# Patient Record
Sex: Female | Born: 1950 | Race: Black or African American | Hispanic: No | Marital: Single | Smoking: Never smoker
Health system: Southern US, Community
[De-identification: ages and names within clinical notes are randomized; demographics above are authoritative.]

## PROBLEM LIST (undated history)

## (undated) DIAGNOSIS — E119 Type 2 diabetes mellitus without complications: Secondary | ICD-10-CM

## (undated) DIAGNOSIS — K219 Gastro-esophageal reflux disease without esophagitis: Secondary | ICD-10-CM

## (undated) DIAGNOSIS — D649 Anemia, unspecified: Secondary | ICD-10-CM

## (undated) DIAGNOSIS — R011 Cardiac murmur, unspecified: Secondary | ICD-10-CM

## (undated) DIAGNOSIS — I10 Essential (primary) hypertension: Secondary | ICD-10-CM

## (undated) DIAGNOSIS — M4802 Spinal stenosis, cervical region: Secondary | ICD-10-CM

## (undated) DIAGNOSIS — M810 Age-related osteoporosis without current pathological fracture: Secondary | ICD-10-CM

## (undated) DIAGNOSIS — Z87442 Personal history of urinary calculi: Secondary | ICD-10-CM

## (undated) DIAGNOSIS — M5030 Other cervical disc degeneration, unspecified cervical region: Secondary | ICD-10-CM

## (undated) DIAGNOSIS — D869 Sarcoidosis, unspecified: Secondary | ICD-10-CM

## (undated) DIAGNOSIS — E785 Hyperlipidemia, unspecified: Secondary | ICD-10-CM

## (undated) DIAGNOSIS — M5134 Other intervertebral disc degeneration, thoracic region: Secondary | ICD-10-CM

## (undated) HISTORY — PX: COLONOSCOPY: SHX174

## (undated) HISTORY — DX: Cardiac murmur, unspecified: R01.1

## (undated) HISTORY — DX: Gastro-esophageal reflux disease without esophagitis: K21.9

## (undated) HISTORY — PX: TUBAL LIGATION: SHX77

---

## 1999-03-10 HISTORY — PX: CARPAL TUNNEL RELEASE: SHX101

## 2005-03-09 HISTORY — PX: CARPAL TUNNEL RELEASE: SHX101

## 2005-05-19 ENCOUNTER — Ambulatory Visit: Payer: Self-pay | Admitting: Internal Medicine

## 2005-05-25 ENCOUNTER — Ambulatory Visit: Payer: Self-pay | Admitting: Internal Medicine

## 2005-08-07 ENCOUNTER — Ambulatory Visit: Payer: Self-pay | Admitting: Unknown Physician Specialty

## 2006-08-17 ENCOUNTER — Ambulatory Visit: Payer: Self-pay | Admitting: Internal Medicine

## 2006-10-08 ENCOUNTER — Ambulatory Visit: Payer: Self-pay | Admitting: Gastroenterology

## 2006-10-08 HISTORY — PX: ESOPHAGOGASTRODUODENOSCOPY: SHX1529

## 2007-09-01 ENCOUNTER — Ambulatory Visit: Payer: Self-pay | Admitting: Internal Medicine

## 2008-09-20 ENCOUNTER — Ambulatory Visit: Payer: Self-pay | Admitting: Internal Medicine

## 2008-09-27 ENCOUNTER — Ambulatory Visit: Payer: Self-pay | Admitting: Internal Medicine

## 2009-12-25 ENCOUNTER — Ambulatory Visit: Payer: Self-pay | Admitting: Internal Medicine

## 2010-01-09 ENCOUNTER — Ambulatory Visit: Payer: Self-pay | Admitting: Internal Medicine

## 2010-01-31 ENCOUNTER — Ambulatory Visit: Payer: Self-pay | Admitting: Specialist

## 2011-02-04 ENCOUNTER — Ambulatory Visit: Payer: Self-pay | Admitting: Internal Medicine

## 2012-02-18 ENCOUNTER — Ambulatory Visit: Payer: Self-pay | Admitting: Internal Medicine

## 2012-11-16 ENCOUNTER — Ambulatory Visit: Payer: Self-pay | Admitting: Gastroenterology

## 2013-08-28 DIAGNOSIS — D869 Sarcoidosis, unspecified: Secondary | ICD-10-CM | POA: Insufficient documentation

## 2013-08-28 DIAGNOSIS — M810 Age-related osteoporosis without current pathological fracture: Secondary | ICD-10-CM | POA: Insufficient documentation

## 2013-08-28 DIAGNOSIS — E785 Hyperlipidemia, unspecified: Secondary | ICD-10-CM | POA: Insufficient documentation

## 2013-08-28 DIAGNOSIS — D649 Anemia, unspecified: Secondary | ICD-10-CM | POA: Insufficient documentation

## 2013-08-28 DIAGNOSIS — K219 Gastro-esophageal reflux disease without esophagitis: Secondary | ICD-10-CM | POA: Insufficient documentation

## 2013-09-28 ENCOUNTER — Ambulatory Visit: Payer: Self-pay | Admitting: Internal Medicine

## 2014-09-18 DIAGNOSIS — R739 Hyperglycemia, unspecified: Secondary | ICD-10-CM | POA: Insufficient documentation

## 2014-10-30 ENCOUNTER — Other Ambulatory Visit: Payer: Self-pay | Admitting: Orthopedic Surgery

## 2014-10-30 DIAGNOSIS — M5013 Cervical disc disorder with radiculopathy, cervicothoracic region: Secondary | ICD-10-CM

## 2014-11-07 ENCOUNTER — Ambulatory Visit
Admission: RE | Admit: 2014-11-07 | Discharge: 2014-11-07 | Disposition: A | Discharge status: Home / Self Care | Payer: Commercial Managed Care - HMO | Source: Ambulatory Visit | Attending: Orthopedic Surgery | Admitting: Orthopedic Surgery

## 2014-11-07 DIAGNOSIS — M5032 Other cervical disc degeneration, mid-cervical region: Secondary | ICD-10-CM | POA: Insufficient documentation

## 2014-11-07 DIAGNOSIS — M5013 Cervical disc disorder with radiculopathy, cervicothoracic region: Secondary | ICD-10-CM | POA: Insufficient documentation

## 2014-11-07 DIAGNOSIS — M4802 Spinal stenosis, cervical region: Secondary | ICD-10-CM | POA: Insufficient documentation

## 2015-01-04 DIAGNOSIS — M5030 Other cervical disc degeneration, unspecified cervical region: Secondary | ICD-10-CM | POA: Insufficient documentation

## 2015-01-04 DIAGNOSIS — M4802 Spinal stenosis, cervical region: Secondary | ICD-10-CM | POA: Insufficient documentation

## 2015-01-04 DIAGNOSIS — M5412 Radiculopathy, cervical region: Secondary | ICD-10-CM | POA: Insufficient documentation

## 2015-03-27 ENCOUNTER — Other Ambulatory Visit: Payer: Self-pay | Admitting: Internal Medicine

## 2015-03-27 DIAGNOSIS — Z1231 Encounter for screening mammogram for malignant neoplasm of breast: Secondary | ICD-10-CM

## 2015-03-27 DIAGNOSIS — M546 Pain in thoracic spine: Secondary | ICD-10-CM

## 2015-04-01 ENCOUNTER — Ambulatory Visit
Admission: RE | Admit: 2015-04-01 | Discharge: 2015-04-01 | Disposition: A | Discharge status: Home / Self Care | Payer: Commercial Managed Care - HMO | Source: Ambulatory Visit | Attending: Internal Medicine | Admitting: Internal Medicine

## 2015-04-01 DIAGNOSIS — M546 Pain in thoracic spine: Secondary | ICD-10-CM | POA: Diagnosis not present

## 2015-04-01 DIAGNOSIS — K760 Fatty (change of) liver, not elsewhere classified: Secondary | ICD-10-CM | POA: Insufficient documentation

## 2015-04-09 ENCOUNTER — Ambulatory Visit
Admission: RE | Admit: 2015-04-09 | Discharge: 2015-04-09 | Disposition: A | Discharge status: Home / Self Care | Payer: Commercial Managed Care - HMO | Source: Ambulatory Visit | Attending: Internal Medicine | Admitting: Internal Medicine

## 2015-04-09 ENCOUNTER — Other Ambulatory Visit: Payer: Self-pay | Admitting: Internal Medicine

## 2015-04-09 DIAGNOSIS — Z1231 Encounter for screening mammogram for malignant neoplasm of breast: Secondary | ICD-10-CM | POA: Insufficient documentation

## 2016-03-19 ENCOUNTER — Other Ambulatory Visit: Payer: Self-pay | Admitting: Internal Medicine

## 2016-03-19 DIAGNOSIS — Z1231 Encounter for screening mammogram for malignant neoplasm of breast: Secondary | ICD-10-CM

## 2016-04-14 ENCOUNTER — Ambulatory Visit
Admission: RE | Admit: 2016-04-14 | Discharge: 2016-04-14 | Disposition: A | Discharge status: Home / Self Care | Payer: Commercial Managed Care - HMO | Source: Ambulatory Visit | Attending: Internal Medicine | Admitting: Internal Medicine

## 2016-04-14 DIAGNOSIS — Z1231 Encounter for screening mammogram for malignant neoplasm of breast: Secondary | ICD-10-CM

## 2017-04-29 ENCOUNTER — Other Ambulatory Visit: Payer: Self-pay | Admitting: Internal Medicine

## 2017-04-29 DIAGNOSIS — Z1231 Encounter for screening mammogram for malignant neoplasm of breast: Secondary | ICD-10-CM

## 2017-05-26 ENCOUNTER — Ambulatory Visit
Admission: RE | Admit: 2017-05-26 | Discharge: 2017-05-26 | Disposition: A | Discharge status: Home / Self Care | Payer: Medicare PPO | Source: Ambulatory Visit | Attending: Internal Medicine | Admitting: Internal Medicine

## 2017-05-26 DIAGNOSIS — Z1231 Encounter for screening mammogram for malignant neoplasm of breast: Secondary | ICD-10-CM | POA: Insufficient documentation

## 2017-11-10 ENCOUNTER — Emergency Department: Payer: Medicare PPO

## 2017-11-10 ENCOUNTER — Other Ambulatory Visit: Payer: Self-pay

## 2017-11-10 ENCOUNTER — Emergency Department
Admission: EM | Admit: 2017-11-10 | Discharge: 2017-11-10 | Disposition: A | Discharge status: Home / Self Care | Payer: Medicare PPO | Attending: Emergency Medicine | Admitting: Emergency Medicine

## 2017-11-10 DIAGNOSIS — N202 Calculus of kidney with calculus of ureter: Secondary | ICD-10-CM | POA: Insufficient documentation

## 2017-11-10 DIAGNOSIS — N200 Calculus of kidney: Secondary | ICD-10-CM

## 2017-11-10 DIAGNOSIS — R109 Unspecified abdominal pain: Secondary | ICD-10-CM | POA: Diagnosis present

## 2017-11-10 LAB — URINALYSIS, COMPLETE (UACMP) WITH MICROSCOPIC
BILIRUBIN URINE: NEGATIVE
Glucose, UA: NEGATIVE mg/dL
KETONES UR: NEGATIVE mg/dL
Leukocytes, UA: NEGATIVE
Nitrite: NEGATIVE
PH: 6 (ref 5.0–8.0)
Protein, ur: 30 mg/dL — AB
RBC / HPF: >50 RBC/hpf — ABNORMAL HIGH (ref 0–5)
SPECIFIC GRAVITY, URINE: 1.024 (ref 1.005–1.030)

## 2017-11-10 LAB — COMPREHENSIVE METABOLIC PANEL
ALT: 35 U/L (ref 0–44)
AST: 28 U/L (ref 15–41)
Albumin: 4.2 g/dL (ref 3.5–5.0)
Alkaline Phosphatase: 41 U/L (ref 38–126)
Anion gap: 9 (ref 5–15)
BUN: 16 mg/dL (ref 8–23)
CHLORIDE: 107 mmol/L (ref 98–111)
CO2: 25 mmol/L (ref 22–32)
CREATININE: 0.64 mg/dL (ref 0.44–1.00)
Calcium: 8.9 mg/dL (ref 8.9–10.3)
GFR calc non Af Amer: >60 mL/min (ref >60)
Glucose, Bld: 131 mg/dL — ABNORMAL HIGH (ref 70–99)
Potassium: 3.6 mmol/L (ref 3.5–5.1)
SODIUM: 141 mmol/L (ref 135–145)
Total Bilirubin: 0.6 mg/dL (ref 0.3–1.2)
Total Protein: 7 g/dL (ref 6.5–8.1)

## 2017-11-10 LAB — CBC
HCT: 38.7 % (ref 35.0–47.0)
Hemoglobin: 13 g/dL (ref 12.0–16.0)
MCH: 30.6 pg (ref 26.0–34.0)
MCHC: 33.6 g/dL (ref 32.0–36.0)
MCV: 91.1 fL (ref 80.0–100.0)
PLATELETS: 240 10*3/uL (ref 150–440)
RBC: 4.24 MIL/uL (ref 3.80–5.20)
RDW: 12.6 % (ref 11.5–14.5)
WBC: 7.8 10*3/uL (ref 3.6–11.0)

## 2017-11-10 LAB — LIPASE, BLOOD: Lipase: 20 U/L (ref 11–51)

## 2017-11-10 MED ORDER — OXYCODONE-ACETAMINOPHEN 5-325 MG PO TABS
1.0000 | ORAL_TABLET | ORAL | Status: DC | PRN
  Administered 2017-11-10: 1 via ORAL
  Filled 2017-11-10: qty 1

## 2017-11-10 MED ORDER — SODIUM CHLORIDE 0.9 % IV BOLUS
1000.0000 mL | Freq: Once | INTRAVENOUS | Status: AC
  Administered 2017-11-10: 1000 mL via INTRAVENOUS

## 2017-11-10 MED ORDER — OXYCODONE HCL 5 MG PO TABS: 5.0000 mg | ORAL_TABLET | Freq: Three times a day (TID) | ORAL | 0 refills | Status: AC | PRN

## 2017-11-10 MED ORDER — ONDANSETRON HCL 4 MG/2ML IJ SOLN
4.0000 mg | Freq: Once | INTRAMUSCULAR | Status: AC
  Administered 2017-11-10: 4 mg via INTRAVENOUS
  Filled 2017-11-10: qty 2

## 2017-11-10 MED ORDER — ONDANSETRON 4 MG PO TBDP
4.0000 mg | ORAL_TABLET | Freq: Once | ORAL | Status: AC
  Administered 2017-11-10: 4 mg via ORAL
  Filled 2017-11-10: qty 1

## 2017-11-10 MED ORDER — ONDANSETRON 4 MG PO TBDP: 4.0000 mg | ORAL_TABLET | Freq: Three times a day (TID) | ORAL | 0 refills | Status: DC | PRN

## 2017-11-10 MED ORDER — MORPHINE SULFATE (PF) 4 MG/ML IV SOLN
4.0000 mg | Freq: Once | INTRAVENOUS | Status: AC
  Administered 2017-11-10: 4 mg via INTRAVENOUS
  Filled 2017-11-10: qty 1

## 2017-11-10 MED ORDER — TAMSULOSIN HCL 0.4 MG PO CAPS: 0.4000 mg | ORAL_CAPSULE | Freq: Every day | ORAL | 0 refills | Status: DC

## 2017-11-10 NOTE — ED Notes (Signed)
Pt unable to urinate at this time. Pt uncomfortable.

## 2017-11-10 NOTE — ED Provider Notes (Signed)
[facility] Emergency Department Provider Note  Time seen: 6:56 PM  I have reviewed the triage vital signs and the nursing notes.   HISTORY  Chief Complaint Flank Pain    HPI Anna Castro is a 67 y.o. female with a past medical history of anemia, sarcoid, hyperlipidemia, gastric reflux, presents to the emergency department for right flank pain.  According to the patient since 4 PM today she developed sudden acute pain in her right upper quadrant right back radiating down into her right lower quadrant.  Occasional nausea, no diarrhea.  Has not noticed any dysuria hematuria or foul smell to her urine.  Denies fever.  Denies any chest pain or trouble breathing.  Describes the pain as moderate to severe sharp currently mostly located in the right mid back at this time.   History reviewed. No pertinent past medical history.  There are no active problems to display for this patient.   History reviewed. No pertinent surgical history.  Prior to Admission medications   Not on File    No Known Allergies  Family History  Problem Relation Age of Onset  . Breast cancer Mother 76    Social History Social History   Tobacco Use  . Smoking status: Never Smoker  Substance Use Topics  . Alcohol use: Never    Frequency: Never  . Drug use: Not on file    Review of Systems Constitutional: Negative for fever. Cardiovascular: Negative for chest pain. Respiratory: Negative for shortness of breath. Gastrointestinal: Moderate sharp right flank pain.  Positive for nausea. Genitourinary: Negative for urinary compaints Musculoskeletal: Negative for musculoskeletal complaints Skin: Negative for skin complaints  Neurological: Negative for headache All other ROS negative  ____________________________________________   PHYSICAL EXAM:  VITAL SIGNS: ED Triage Vitals  Enc Vitals Group     BP 11/10/17 1837 (!) 153/64     Pulse Rate 11/10/17 1743 65     Resp  11/10/17 1743 16     Temp 11/10/17 1743 97.6 F (36.4 C)     Temp Source 11/10/17 1743 Oral     SpO2 11/10/17 1743 98 %     Weight 11/10/17 1744 194 lb (88 kg)     Height 11/10/17 1744 5\' 3"  (1.6 m)     Head Circumference --      Peak Flow --      Pain Score 11/10/17 1743 7     Pain Loc --      Pain Edu? --      Excl. in [place]? --     Constitutional: Alert and oriented.  Mild distress due to pain Eyes: Normal exam ENT   Head: Normocephalic and atraumatic   Mouth/Throat: Mucous membranes are moist. Cardiovascular: Normal rate, regular rhythm. No murmur Respiratory: Normal respiratory effort without tachypnea nor retractions. Breath sounds are clear  Gastrointestinal: Soft, moderate right upper quadrant tenderness to palpation, no rebound guarding or distention.  Abdomen otherwise benign. Musculoskeletal: Nontender with normal range of motion in all extremities.  Neurologic:  Normal speech and language. No gross focal neurologic deficits Skin:  Skin is warm, dry and intact.  Psychiatric: Mood and affect are normal.  ____________________________________________   RADIOLOGY  Right 4 mm ureteral stone.  ____________________________________________   INITIAL IMPRESSION / ASSESSMENT AND PLAN / ED COURSE  Pertinent labs & imaging results that were available during my care of the patient were reviewed by me and considered in my medical decision making (see chart for details).  Patient presents  to the emergency department for right-sided abdominal pain.  Patient does have moderate tenderness in the right upper quadrant but states most of her pain is in the right back and occasionally radiates to the right lower quadrant.  States sudden onset of sharp pain.  Differential would include ureterolithiasis versus biliary colic versus cholecystitis, less likely colitis or diverticulitis possible urinary tract infection or pyelonephritis.  We will check labs, treat pain and continue to  closely monitor.  I suspect this is likely ureterolithiasis however the patient does currently have moderate right upper quadrant discomfort as well.  Will await lab results before deciding on imaging modality.  Urinalysis shows red blood cells otherwise largely negative.  Urine culture has been added on.  I discussed the follow-up precautions for any fever worsening pain also discussed urology follow-up.  We will discharge with Percocet, Zofran, Flomax.  ____________________________________________   FINAL CLINICAL IMPRESSION(S) / ED DIAGNOSES  Right flank pain Kidney stone   Harvest Dark, MD 11/10/17 2133

## 2017-11-10 NOTE — ED Notes (Signed)
Pt had episode of vomiting in lobby, pt placed in wheelchair, informed her she would be going back to a room as soon as possible.

## 2017-11-10 NOTE — ED Triage Notes (Signed)
Right flank pain that began suddenly at 1600 today. Nausea.   Pt cannot sit still due to pain. Nausea.   Pt alert and oriented X4, active, cooperative, pt in NAD. RR even and unlabored, color WNL.

## 2017-11-10 NOTE — ED Notes (Signed)
Patient transported to CT 

## 2017-11-10 NOTE — ED Notes (Signed)
FIRST NURSE NOTE:  Pt reports right side pain, appears to be in some discomfort on arrival, however, no distress noted.

## 2017-11-12 LAB — URINE CULTURE: CULTURE: NO GROWTH

## 2017-11-25 ENCOUNTER — Encounter: Payer: Self-pay | Admitting: Urology

## 2017-11-25 ENCOUNTER — Ambulatory Visit: Payer: Medicare PPO | Admitting: Urology

## 2017-11-25 VITALS — BP 162/78 | HR 77 | Ht 62.0 in | Wt 196.0 lb

## 2017-11-25 DIAGNOSIS — N200 Calculus of kidney: Secondary | ICD-10-CM | POA: Diagnosis not present

## 2017-11-25 NOTE — Progress Notes (Signed)
11/25/2017 9:07 AM   Anna Castro 11-Dec-1950 [id number]  Referring provider: Idelle Crouch, MD [facility] [place], [place] [address]  Chief Complaint  Patient presents with  . Nephrolithiasis    HPI: 67 year old female who presented to the ED on 11/10/2017 with acute onset of right flank pain which radiated to the right lower quadrant.  She had nausea without vomiting.  She had no voiding complaints.  There were no identifiable precipitating, aggravating or alleviating factors to her pain.  A stone protocol CT of the abdomen pelvis was performed which showed a 4 mm right UPJ calculus with mild right hydronephrosis.  There was a punctate left lower pole nonobstructing calculus along with a small right renal cyst.  She passed the stone 4 to 5 days after her ED visit.  She still complains of mild right back pain which is not severe and she is taking Tylenol.  Denies previous history of urologic problems or prior stone disease.  There is no history of chronic bowel disease or intestinal surgery.  She did bring her stone in today.   PMH: Past Medical History:  Diagnosis Date  . GERD (gastroesophageal reflux disease)   . Heart murmur     Surgical History: History reviewed. No pertinent surgical history.  Home Medications:  Allergies as of 11/25/2017   No Known Allergies     Medication List        Accurate as of 11/25/17  9:07 AM. Always use your most recent med list.          alendronate 70 MG tablet Commonly known as:  FOSAMAX Take by mouth.   bimatoprost 0.01 % Soln Commonly known as:  LUMIGAN 1 drop nightly.   lansoprazole 30 MG capsule Commonly known as:  PREVACID Take by mouth.   meloxicam 7.5 MG tablet Commonly known as:  MOBIC TAKE 1 TABLET ONE TIME DAILY   ondansetron 4 MG disintegrating tablet Commonly known as:  ZOFRAN-ODT Take 1 tablet (4 mg total) by mouth every 8 (eight) hours as needed for nausea or  vomiting.   oxyCODONE 5 MG immediate release tablet Commonly known as:  Oxy IR/ROXICODONE Take 1 tablet (5 mg total) by mouth every 8 (eight) hours as needed.   tamsulosin 0.4 MG Caps capsule Commonly known as:  FLOMAX Take 1 capsule (0.4 mg total) by mouth daily.   Vitamin D (Ergocalciferol) 50000 units Caps capsule Commonly known as:  DRISDOL TAKE 1 CAPSULE ONE TIME WEEKLY       Allergies: No Known Allergies  Family History: Family History  Problem Relation Age of Onset  . Breast cancer Mother 41  . Kidney disease Mother     Social History:  reports that she has never smoked. She has never used smokeless tobacco. She reports that she does not drink alcohol or use drugs.  ROS: UROLOGY Frequent Urination?: Yes Hard to postpone urination?: No Burning/pain with urination?: No Get up at night to urinate?: Yes Leakage of urine?: Yes Urine stream starts and stops?: No Trouble starting stream?: No Do you have to strain to urinate?: No Blood in urine?: No Urinary tract infection?: No Sexually transmitted disease?: No Injury to kidneys or bladder?: No Painful intercourse?: No Weak stream?: No Currently pregnant?: No Vaginal bleeding?: No Last menstrual period?: Postmenopausal  Gastrointestinal Nausea?: Yes Vomiting?: Yes Indigestion/heartburn?: Yes Diarrhea?: No Constipation?: No  Constitutional Fever: No Night sweats?: No Weight loss?: No Fatigue?: No  Skin Skin rash/lesions?: Yes Itching?:  No  Eyes Blurred vision?: No Double vision?: No  Ears/Nose/Throat Sore throat?: No Sinus problems?: No  Hematologic/Lymphatic Swollen glands?: No Easy bruising?: No  Cardiovascular Leg swelling?: No Chest pain?: No  Respiratory Cough?: No Shortness of breath?: No  Endocrine Excessive thirst?: No  Musculoskeletal Back pain?: Yes Joint pain?: No  Neurological Headaches?: No Dizziness?: No  Psychologic Depression?: No Anxiety?: No  Physical  Exam: BP (!) 162/78 (BP Location: Left Arm, Patient Position: Sitting, Cuff Size: Large)   Pulse 77   Ht 5\' 2"  (1.575 m)   Wt 196 lb (88.9 kg)   BMI 35.85 kg/m   Constitutional:  Alert and oriented, No acute distress. HEENT: [place] AT, moist mucus membranes.  Trachea midline, no masses. Cardiovascular: No clubbing, cyanosis, or edema. Respiratory: Normal respiratory effort, no increased work of breathing. GI: Abdomen is soft, nontender, nondistended, no abdominal masses GU: No CVA tenderness Lymph: No cervical or inguinal lymphadenopathy. Skin: No rashes, bruises or suspicious lesions. Neurologic: Grossly intact, no focal deficits, moving all 4 extremities. Psychiatric: Normal mood and affect.    Assessment & Plan:   67 year old female with a recently passed renal calculus.  She is still having mild right back pain and will obtain a follow-up renal ultrasound.  She will be notified with results.  She does have a small left renal calculus and have recommended a metabolic evaluation to include blood work a 24-hour urine study.  Her stone will be sent for analysis in addition.   Anna Castro, Anna Castro [address] [place], [place] [address] [phone]

## 2017-12-02 ENCOUNTER — Other Ambulatory Visit: Payer: Self-pay | Admitting: Urology

## 2017-12-10 ENCOUNTER — Ambulatory Visit
Admission: RE | Admit: 2017-12-10 | Discharge: 2017-12-10 | Disposition: A | Discharge status: Home / Self Care | Payer: Medicare PPO | Source: Ambulatory Visit | Attending: Urology | Admitting: Urology

## 2017-12-10 DIAGNOSIS — N1330 Unspecified hydronephrosis: Secondary | ICD-10-CM | POA: Diagnosis not present

## 2017-12-10 DIAGNOSIS — Z87442 Personal history of urinary calculi: Secondary | ICD-10-CM | POA: Diagnosis not present

## 2017-12-10 DIAGNOSIS — M545 Low back pain: Secondary | ICD-10-CM | POA: Diagnosis present

## 2017-12-10 DIAGNOSIS — N200 Calculus of kidney: Secondary | ICD-10-CM

## 2017-12-13 ENCOUNTER — Telehealth: Payer: Self-pay

## 2017-12-13 NOTE — Telephone Encounter (Signed)
-----   Message from Abbie Sons, MD sent at 12/10/2017  4:46 PM EDT ----- Renal ultrasound was normal and the blockage from her previous stone has resolved.  If she is still having back pain would have her follow-up with her PCP for further evaluation.  Proceed with metabolic evaluation as we discussed at the last office visit.

## 2017-12-13 NOTE — Telephone Encounter (Signed)
Left pt mess to call 

## 2018-01-19 ENCOUNTER — Ambulatory Visit: Payer: Medicare PPO | Admitting: Certified Registered Nurse Anesthetist

## 2018-01-19 ENCOUNTER — Encounter: Payer: Self-pay | Admitting: Student

## 2018-01-19 ENCOUNTER — Other Ambulatory Visit: Payer: Self-pay

## 2018-01-19 ENCOUNTER — Encounter
Admission: RE | Disposition: A | Discharge status: Home / Self Care | Payer: Self-pay | Source: Ambulatory Visit | Attending: Internal Medicine

## 2018-01-19 ENCOUNTER — Ambulatory Visit
Admission: RE | Admit: 2018-01-19 | Discharge: 2018-01-19 | Disposition: A | Discharge status: Home / Self Care | Payer: Medicare PPO | Source: Ambulatory Visit | Attending: Internal Medicine | Admitting: Internal Medicine

## 2018-01-19 DIAGNOSIS — Z1211 Encounter for screening for malignant neoplasm of colon: Secondary | ICD-10-CM | POA: Insufficient documentation

## 2018-01-19 DIAGNOSIS — M810 Age-related osteoporosis without current pathological fracture: Secondary | ICD-10-CM | POA: Insufficient documentation

## 2018-01-19 DIAGNOSIS — Z6834 Body mass index (BMI) 34.0-34.9, adult: Secondary | ICD-10-CM | POA: Insufficient documentation

## 2018-01-19 DIAGNOSIS — Z79899 Other long term (current) drug therapy: Secondary | ICD-10-CM | POA: Diagnosis not present

## 2018-01-19 DIAGNOSIS — Z791 Long term (current) use of non-steroidal anti-inflammatories (NSAID): Secondary | ICD-10-CM | POA: Insufficient documentation

## 2018-01-19 DIAGNOSIS — K640 First degree hemorrhoids: Secondary | ICD-10-CM | POA: Diagnosis not present

## 2018-01-19 DIAGNOSIS — Z7983 Long term (current) use of bisphosphonates: Secondary | ICD-10-CM | POA: Diagnosis not present

## 2018-01-19 DIAGNOSIS — D124 Benign neoplasm of descending colon: Secondary | ICD-10-CM | POA: Diagnosis not present

## 2018-01-19 DIAGNOSIS — K219 Gastro-esophageal reflux disease without esophagitis: Secondary | ICD-10-CM | POA: Diagnosis not present

## 2018-01-19 DIAGNOSIS — Z800 Family history of malignant neoplasm of digestive organs: Secondary | ICD-10-CM | POA: Diagnosis not present

## 2018-01-19 HISTORY — DX: Hyperlipidemia, unspecified: E78.5

## 2018-01-19 HISTORY — DX: Anemia, unspecified: D64.9

## 2018-01-19 HISTORY — PX: COLONOSCOPY WITH PROPOFOL: SHX5780

## 2018-01-19 HISTORY — DX: Age-related osteoporosis without current pathological fracture: M81.0

## 2018-01-19 SURGERY — COLONOSCOPY WITH PROPOFOL
Anesthesia: General

## 2018-01-19 MED ORDER — SODIUM CHLORIDE 0.9 % IV SOLN
INTRAVENOUS | Status: DC
  Administered 2018-01-19: 10:00:00 via INTRAVENOUS

## 2018-01-19 MED ORDER — LIDOCAINE HCL (PF) 1 % IJ SOLN
INTRAMUSCULAR | Status: AC
  Filled 2018-01-19: qty 2

## 2018-01-19 MED ORDER — PROPOFOL 500 MG/50ML IV EMUL
INTRAVENOUS | Status: DC | PRN
  Administered 2018-01-19: 160 ug/kg/min via INTRAVENOUS

## 2018-01-19 MED ORDER — PROPOFOL 10 MG/ML IV BOLUS
INTRAVENOUS | Status: DC | PRN
  Administered 2018-01-19: 70 mg via INTRAVENOUS

## 2018-01-19 NOTE — H&P (Signed)
  Outpatient short stay form Pre-procedure 01/19/2018 9:58 AM  K. Alice Reichert, M.D.  Primary Physician: Fulton Reek, M.D.  Reason for visit:  Family history of colon cancer.  History of present illness:   67 year old patient presenting for family history of colon cancer. Patient denies any change in bowel habits, rectal bleeding or involuntary weight loss.    Current Facility-Administered Medications:  .  0.9 %  sodium chloride infusion, , Intravenous, Continuous, [place], Benay Pike, MD, Last Rate: 20 mL/hr at 01/19/18 0949 .  lidocaine (PF) (XYLOCAINE) 1 % injection, , , ,   Medications Prior to Admission  Medication Sig Dispense Refill Last Dose  . lansoprazole (PREVACID) 30 MG capsule Take by mouth.   Past Week at Unknown time  . meloxicam (MOBIC) 7.5 MG tablet TAKE 1 TABLET ONE TIME DAILY   01/17/2018 at Unknown time  . alendronate (FOSAMAX) 70 MG tablet Take by mouth.   Taking  . bimatoprost (LUMIGAN) 0.01 % SOLN 1 drop nightly.   Taking  . ondansetron (ZOFRAN ODT) 4 MG disintegrating tablet Take 1 tablet (4 mg total) by mouth every 8 (eight) hours as needed for nausea or vomiting. (Patient not taking: Reported on 01/19/2018) 20 tablet 0 Not Taking at Unknown time  . oxyCODONE (ROXICODONE) 5 MG immediate release tablet Take 1 tablet (5 mg total) by mouth every 8 (eight) hours as needed. (Patient not taking: Reported on 01/19/2018) 20 tablet 0 Not Taking at Unknown time  . tamsulosin (FLOMAX) 0.4 MG CAPS capsule Take 1 capsule (0.4 mg total) by mouth daily. (Patient not taking: Reported on 01/19/2018) 30 capsule 0 Not Taking at Unknown time  . Vitamin D, Ergocalciferol, (DRISDOL) 50000 units CAPS capsule TAKE 1 CAPSULE ONE TIME WEEKLY   01/16/2018     No Known Allergies   Past Medical History:  Diagnosis Date  . Anemia   . GERD (gastroesophageal reflux disease)   . Heart murmur   . Hyperlipidemia   . Osteoporosis     Review of systems:  Otherwise negative.     Physical Exam  Gen: Alert, oriented. Appears stated age.  HEENT: [place]/AT. PERRLA. Lungs: CTA, no wheezes. CV: RR nl S1, S2. Abd: soft, benign, no masses. BS+ Ext: No edema. Pulses 2+    Planned procedures: Proceed with colonoscopy. The patient understands the nature of the planned procedure, indications, risks, alternatives and potential complications including but not limited to bleeding, infection, perforation, damage to internal organs and possible oversedation/side effects from anesthesia. The patient agrees and gives consent to proceed.  Please refer to procedure notes for findings, recommendations and patient disposition/instructions.      K. Alice Reichert, M.D. Gastroenterology 01/19/2018  9:58 AM

## 2018-01-19 NOTE — Anesthesia Procedure Notes (Signed)
Performed by: Ivry Pigue, CRNA Pre-anesthesia Checklist: Patient identified, Emergency Drugs available, Suction available, Patient being monitored and Timeout performed Patient Re-evaluated:Patient Re-evaluated prior to induction Oxygen Delivery Method: Nasal cannula Induction Type: IV induction       

## 2018-01-19 NOTE — Interval H&P Note (Signed)
History and Physical Interval Note:  01/19/2018 9:59 AM  Anna Castro  has presented today for surgery, with the diagnosis of FH COLON CANCER  The various methods of treatment have been discussed with the patient and family. After consideration of risks, benefits and other options for treatment, the patient has consented to  Procedure(s): COLONOSCOPY WITH PROPOFOL (N/A) as a surgical intervention .  The patient's history has been reviewed, patient examined, no change in status, stable for surgery.  I have reviewed the patient's chart and labs.  Questions were answered to the patient's satisfaction.     [place], [place]

## 2018-01-19 NOTE — Anesthesia Post-op Follow-up Note (Signed)
Anesthesia QCDR form completed.        

## 2018-01-19 NOTE — Anesthesia Preprocedure Evaluation (Signed)
Anesthesia Evaluation  Patient identified by MRN, date of birth, ID band Patient awake    Reviewed: Allergy & Precautions, H&P , NPO status , Patient's Chart, lab work & pertinent test results, reviewed documented beta blocker date and time   Airway Mallampati: II   Neck ROM: full    Dental  (+) Teeth Intact   Pulmonary neg pulmonary ROS,    Pulmonary exam normal        Cardiovascular Exercise Tolerance: Good Normal cardiovascular exam+ Valvular Problems/Murmurs  Rhythm:regular Rate:Normal     Neuro/Psych  Neuromuscular disease negative psych ROS   GI/Hepatic Neg liver ROS, GERD  Medicated,  Endo/Other  Morbid obesity  Renal/GU negative Renal ROS  negative genitourinary   Musculoskeletal   Abdominal   Peds  Hematology  (+) Blood dyscrasia, anemia ,   Anesthesia Other Findings Past Medical History: No date: Anemia No date: GERD (gastroesophageal reflux disease) No date: Heart murmur No date: Hyperlipidemia No date: Osteoporosis Past Surgical History: 02/19/1997, 09/16/2001, 10/08/2006,: COLONOSCOPY 10/08/2006: ESOPHAGOGASTRODUODENOSCOPY BMI    Body Mass Index:  34.72 kg/m     Reproductive/Obstetrics negative OB ROS                             Anesthesia Physical Anesthesia Plan  ASA: III  Anesthesia Plan: General   Post-op Pain Management:    Induction:   PONV Risk Score and Plan:   Airway Management Planned:   Additional Equipment:   Intra-op Plan:   Post-operative Plan:   Informed Consent: I have reviewed the patients History and Physical, chart, labs and discussed the procedure including the risks, benefits and alternatives for the proposed anesthesia with the patient or authorized representative who has indicated his/her understanding and acceptance.   Dental Advisory Given  Plan Discussed with: CRNA  Anesthesia Plan Comments:         Anesthesia Quick  Evaluation

## 2018-01-19 NOTE — Op Note (Signed)
[facility] Gastroenterology Patient Name: Anna Castro Procedure Date: 01/19/2018 9:58 AM MRN: [id number] Account #: [id number] Date of Birth: 1950-04-23 Admit Type: Outpatient Age: 67 Room: [facility] ENDO ROOM 3 Gender: Female Note Status: Finalized Procedure:            Colonoscopy Indications:          Screening in patient at increased risk: Family history                        of 1st-degree relative with colorectal cancer Providers:            Benay Pike. Alice Reichert MD, MD Referring MD:         Leonie Douglas. Doy Hutching, MD (Referring MD) Medicines:            Propofol per Anesthesia Complications:        No immediate complications. Procedure:            Pre-Anesthesia Assessment:                       - The risks and benefits of the procedure and the                        sedation options and risks were discussed with the                        patient. All questions were answered and informed                        consent was obtained.                       - Patient identification and proposed procedure were                        verified prior to the procedure by the nurse. The                        procedure was verified in the procedure room.                       - ASA Grade Assessment: III - A patient with severe                        systemic disease.                       - After reviewing the risks and benefits, the patient                        was deemed in satisfactory condition to undergo the                        procedure.                       After obtaining informed consent, the colonoscope was                        passed under direct vision. Throughout the procedure,  the patient's blood pressure, pulse, and oxygen                        saturations were monitored continuously. The                        Colonoscope was introduced through the anus and                        advanced to the the cecum, identified by  appendiceal                        orifice and ileocecal valve. The colonoscopy was                        performed without difficulty. The patient tolerated the                        procedure well. The quality of the bowel preparation                        was good. The ileocecal valve, appendiceal orifice, and                        rectum were photographed. Findings:      The perianal and digital rectal examinations were normal. Pertinent       negatives include normal sphincter tone and no palpable rectal lesions.      A 4 mm polyp was found in the descending colon. The polyp was sessile.       The polyp was removed with a jumbo cold forceps. Resection and retrieval       were complete.      Non-bleeding internal hemorrhoids were found during retroflexion. The       hemorrhoids were Grade I (internal hemorrhoids that do not prolapse).      The exam was otherwise without abnormality. Impression:           - One 4 mm polyp in the descending colon, removed with                        a jumbo cold forceps. Resected and retrieved.                       - Non-bleeding internal hemorrhoids.                       - The examination was otherwise normal. Recommendation:       - Patient has a contact number available for                        emergencies. The signs and symptoms of potential                        delayed complications were discussed with the patient.                        Return to normal activities tomorrow. Written discharge                        instructions were provided to the  patient.                       - Resume previous diet.                       - Continue present medications.                       - Repeat colonoscopy is recommended for surveillance.                        The colonoscopy date will be determined after pathology                        results from today's exam become available for review.                       - Return to GI office PRN.                        - The findings and recommendations were discussed with                        the patient and their spouse. Procedure Code(s):    --- Professional ---                       [phone], Colonoscopy, flexible; with biopsy, single or                        multiple Diagnosis Code(s):    --- Professional ---                       K64.0, First degree hemorrhoids                       D12.4, Benign neoplasm of descending colon                       Z80.0, Family history of malignant neoplasm of                        digestive organs CPT copyright 2018 American Medical Association. All rights reserved. The codes documented in this report are preliminary and upon coder review may  be revised to meet current compliance requirements. Efrain Sella MD, MD 01/19/2018 10:15:52 AM This report has been signed electronically. Number of Addenda: 0 Note Initiated On: 01/19/2018 9:58 AM Scope Withdrawal Time: 0 hours 6 minutes 44 seconds  Total Procedure Duration: 0 hours 8 minutes 49 seconds       [facility]

## 2018-01-19 NOTE — Transfer of Care (Signed)
Immediate Anesthesia Transfer of Care Note  Patient: Anna Castro  Procedure(s) Performed: COLONOSCOPY WITH PROPOFOL (N/A )  Patient Location: PACU  Anesthesia Type:General  Level of Consciousness: drowsy  Airway & Oxygen Therapy: Patient Spontanous Breathing and Patient connected to nasal cannula oxygen  Post-op Assessment: Report given to RN and Post -op Vital signs reviewed and stable  Post vital signs: Reviewed and stable  Last Vitals:  Vitals Value Taken Time  BP 119/73 01/19/2018 10:16 AM  Temp 36.3 C 01/19/2018 10:16 AM  Pulse 76 01/19/2018 10:17 AM  Resp 14 01/19/2018 10:17 AM  SpO2 97 % 01/19/2018 10:17 AM  Vitals shown include unvalidated device data.  Last Pain:  Vitals:   01/19/18 1016  TempSrc: Tympanic  PainSc:          Complications: No apparent anesthesia complications

## 2018-01-20 ENCOUNTER — Encounter: Payer: Self-pay | Admitting: Internal Medicine

## 2018-01-20 LAB — SURGICAL PATHOLOGY

## 2018-01-20 NOTE — Anesthesia Postprocedure Evaluation (Signed)
Anesthesia Post Note  Patient: Anna Castro  Procedure(s) Performed: COLONOSCOPY WITH PROPOFOL (N/A )  Patient location during evaluation: PACU Anesthesia Type: General Level of consciousness: awake and alert Pain management: pain level controlled Vital Signs Assessment: post-procedure vital signs reviewed and stable Respiratory status: spontaneous breathing, nonlabored ventilation, respiratory function stable and patient connected to nasal cannula oxygen Cardiovascular status: blood pressure returned to baseline and stable Postop Assessment: no apparent nausea or vomiting Anesthetic complications: no     Last Vitals:  Vitals:   01/19/18 1026 01/19/18 1036  BP: 114/61 124/64  Pulse:    Resp:  20  Temp:    SpO2:  98%    Last Pain:  Vitals:   01/20/18 0816  TempSrc:   PainSc: 0-No pain                 Molli Barrows

## 2018-07-18 ENCOUNTER — Other Ambulatory Visit: Payer: Self-pay | Admitting: Internal Medicine

## 2018-07-18 DIAGNOSIS — Z1231 Encounter for screening mammogram for malignant neoplasm of breast: Secondary | ICD-10-CM

## 2018-07-25 ENCOUNTER — Ambulatory Visit
Admission: RE | Admit: 2018-07-25 | Discharge: 2018-07-25 | Disposition: A | Discharge status: Home / Self Care | Payer: Medicare HMO | Source: Ambulatory Visit | Attending: Internal Medicine | Admitting: Internal Medicine

## 2018-07-25 ENCOUNTER — Other Ambulatory Visit: Payer: Self-pay

## 2018-07-25 DIAGNOSIS — Z1231 Encounter for screening mammogram for malignant neoplasm of breast: Secondary | ICD-10-CM | POA: Diagnosis present

## 2018-10-22 ENCOUNTER — Other Ambulatory Visit: Payer: Self-pay

## 2018-10-22 DIAGNOSIS — Z20822 Contact with and (suspected) exposure to covid-19: Secondary | ICD-10-CM

## 2018-10-23 LAB — NOVEL CORONAVIRUS, NAA: SARS-CoV-2, NAA: NOT DETECTED

## 2019-01-24 ENCOUNTER — Other Ambulatory Visit: Payer: Self-pay | Admitting: Internal Medicine

## 2019-01-24 DIAGNOSIS — R1011 Right upper quadrant pain: Secondary | ICD-10-CM

## 2019-01-31 ENCOUNTER — Ambulatory Visit
Admission: RE | Admit: 2019-01-31 | Discharge: 2019-01-31 | Disposition: A | Discharge status: Home / Self Care | Payer: Medicare HMO | Source: Ambulatory Visit | Attending: Internal Medicine | Admitting: Internal Medicine

## 2019-01-31 ENCOUNTER — Other Ambulatory Visit: Payer: Self-pay

## 2019-01-31 DIAGNOSIS — R1011 Right upper quadrant pain: Secondary | ICD-10-CM

## 2019-02-01 ENCOUNTER — Other Ambulatory Visit: Payer: Self-pay

## 2019-02-01 ENCOUNTER — Ambulatory Visit
Admission: RE | Admit: 2019-02-01 | Discharge: 2019-02-01 | Disposition: A | Discharge status: Home / Self Care | Payer: Medicare HMO | Source: Ambulatory Visit | Attending: Internal Medicine | Admitting: Internal Medicine

## 2019-02-01 DIAGNOSIS — R1011 Right upper quadrant pain: Secondary | ICD-10-CM | POA: Diagnosis present

## 2019-04-21 ENCOUNTER — Ambulatory Visit: Payer: Medicare HMO | Attending: Internal Medicine

## 2019-04-21 DIAGNOSIS — Z23 Encounter for immunization: Secondary | ICD-10-CM | POA: Insufficient documentation

## 2019-04-21 NOTE — Progress Notes (Signed)
   Covid-19 Vaccination Clinic  Name:  Anna Castro    MRN: [id number] DOB: May 11, 1950  04/21/2019  Anna Castro was observed post Covid-19 immunization for 15 minutes without incidence. She was provided with Vaccine Information Sheet and instruction to access the V-Safe system.   Anna Castro was instructed to call 911 with any severe reactions post vaccine: Marland Kitchen Difficulty breathing  . Swelling of your face and throat  . A fast heartbeat  . A bad rash all over your body  . Dizziness and weakness    Immunizations Administered    Name Date Dose VIS Date Route   Moderna COVID-19 Vaccine 04/21/2019 12:13 PM 0.5 mL 02/07/2019 Intramuscular   Manufacturer: Moderna   Lot: [id number]   [id number]

## 2019-05-22 ENCOUNTER — Ambulatory Visit: Payer: Medicare HMO | Attending: Internal Medicine

## 2019-05-22 DIAGNOSIS — Z23 Encounter for immunization: Secondary | ICD-10-CM

## 2019-05-22 NOTE — Progress Notes (Signed)
   Covid-19 Vaccination Clinic  Name:  Anna Castro    MRN: [id number] DOB: [birth year]  05/22/2019  [name] was observed post Covid-19 immunization for 15 minutes without incident. She was provided with Vaccine Information Sheet and instruction to access the V-Safe system.   [name] was instructed to call 911 with any severe reactions post vaccine: Marland Kitchen Difficulty breathing  . Swelling of face and throat  . A fast heartbeat  . A bad rash all over body  . Dizziness and weakness   Immunizations Administered    Name Date Dose VIS Date Route   Moderna COVID-19 Vaccine 05/22/2019 12:08 PM 0.5 mL 02/07/2019 Intramuscular   Manufacturer: Moderna   Lot: [id number]   [place]:[id number]

## 2019-06-27 IMAGING — US US RENAL
1 series · 14 of 25 positions shown · non-contrast
Comparison: CT 11/10/2017

CLINICAL DATA: Nephrolithiasis

EXAM:
RENAL / URINARY TRACT ULTRASOUND COMPLETE

[Series 1: us renal · 0.28mm/px · 14 of 34 slices shown]
[im 1/34]
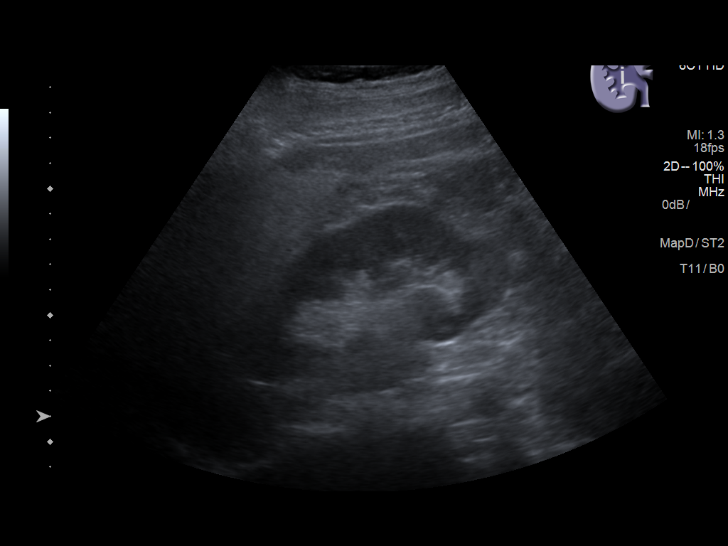
[im 3/34]
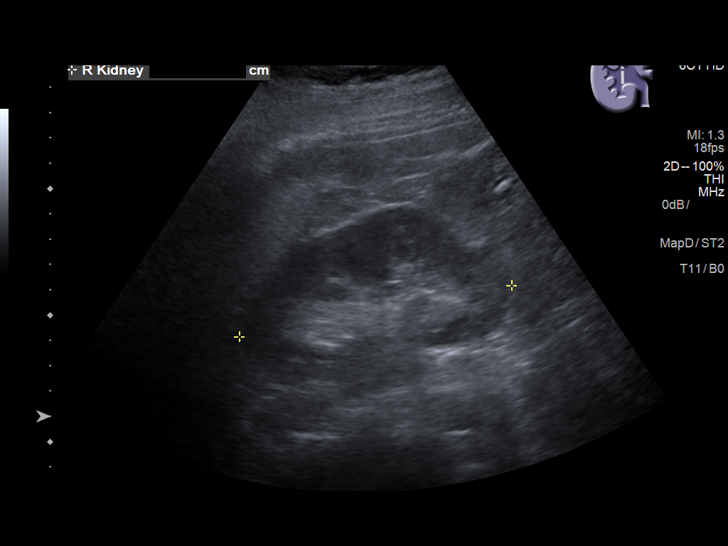
[im 6/34]
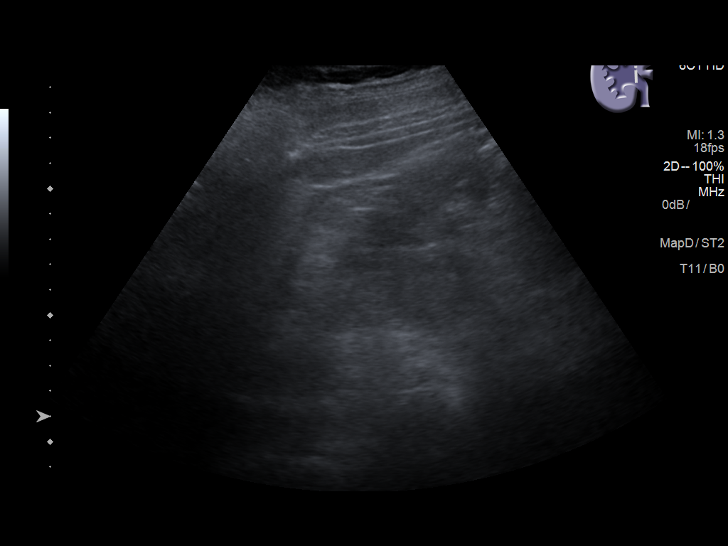
[im 9/34]
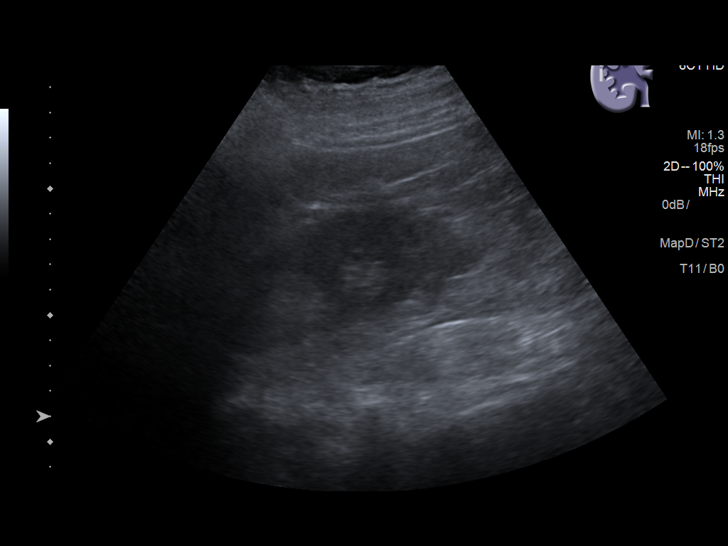
[im 12/34]
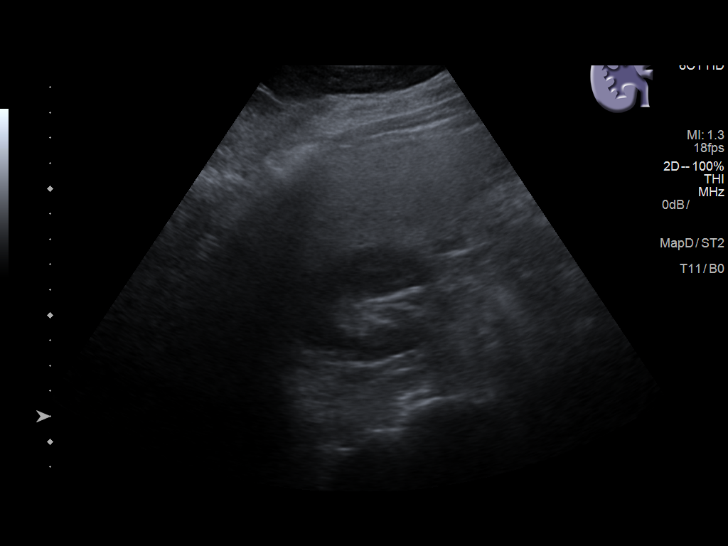
[im 13/34]
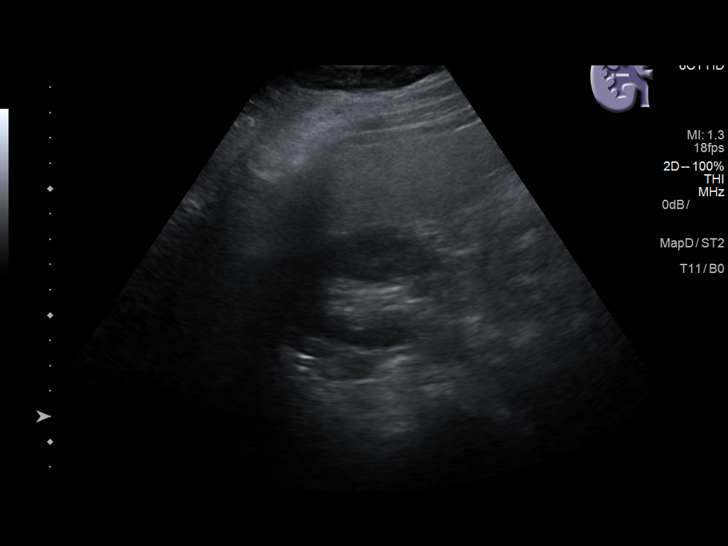
[im 16/34]
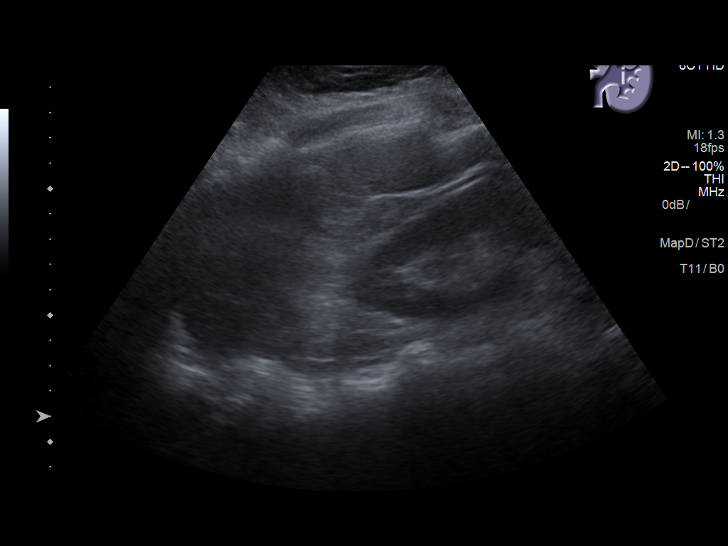
[im 18/34]
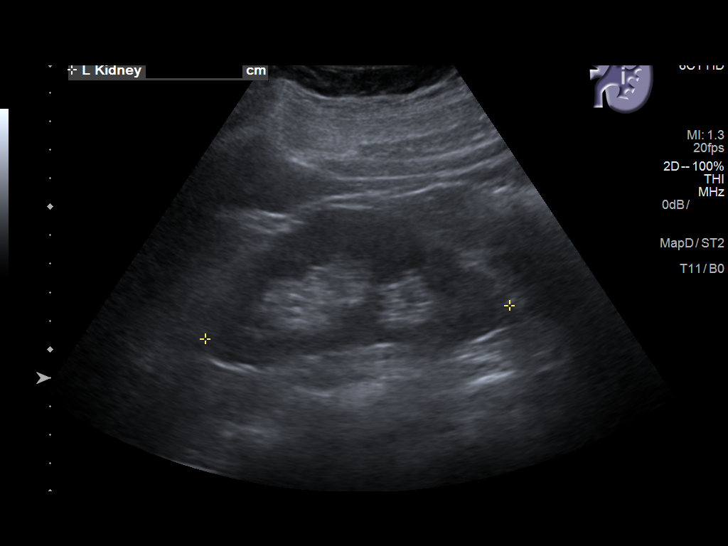
[im 21/34]
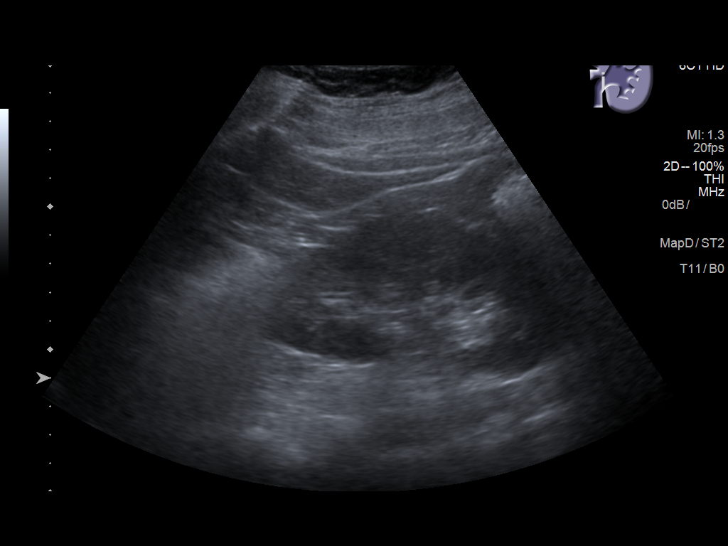
[im 23/34]
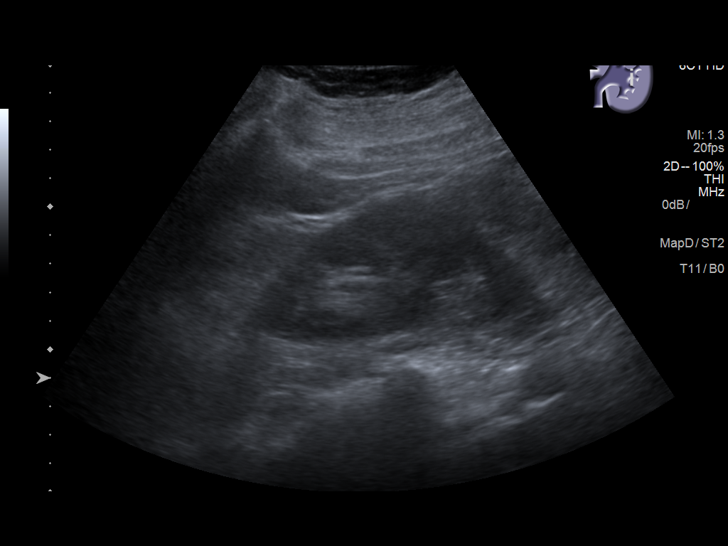
[im 25/34]
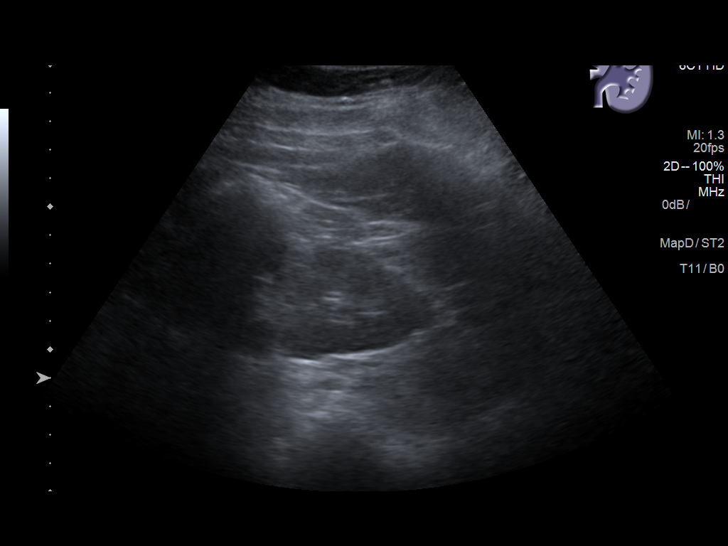
[im 28/34]
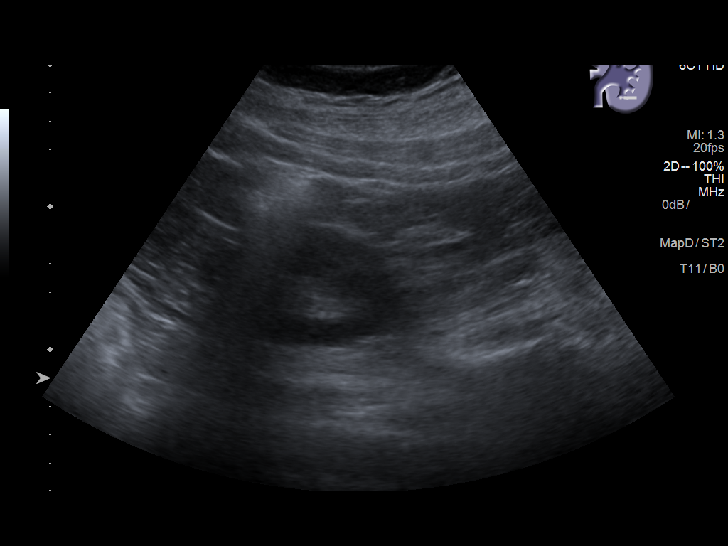
[im 31/34]
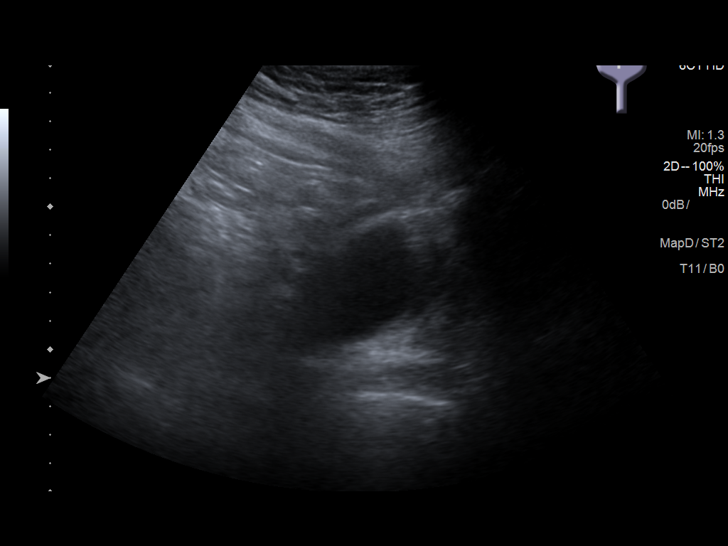
[im 34/34]
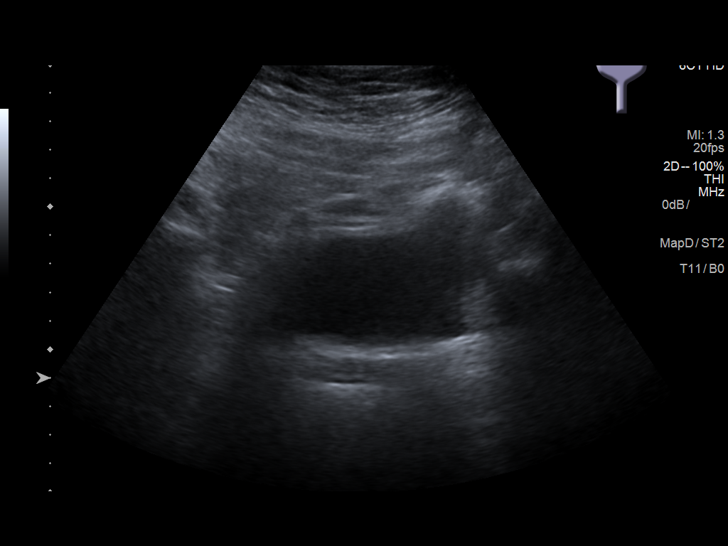

[14 of 25 positions shown; findings below may reference images not displayed]

FINDINGS: Right Kidney:

Length: 10.9 cm. Echogenicity within normal limits. No mass or
hydronephrosis visualized.

Left Kidney:

Length: 10.7 cm. Echogenicity within normal limits. No mass or
hydronephrosis visualized.

Bladder:

Appears normal for degree of bladder distention.
IMPRESSION: Previously seen right hydronephrosis on CT has resolved. No acute
findings. No current hydronephrosis.

## 2019-07-03 ENCOUNTER — Other Ambulatory Visit: Payer: Self-pay | Admitting: Internal Medicine

## 2019-07-03 DIAGNOSIS — Z1231 Encounter for screening mammogram for malignant neoplasm of breast: Secondary | ICD-10-CM

## 2019-07-03 DIAGNOSIS — M79662 Pain in left lower leg: Secondary | ICD-10-CM

## 2019-07-07 ENCOUNTER — Ambulatory Visit
Admission: RE | Admit: 2019-07-07 | Discharge: 2019-07-07 | Disposition: A | Discharge status: Home / Self Care | Payer: Medicare HMO | Source: Ambulatory Visit | Attending: Internal Medicine | Admitting: Internal Medicine

## 2019-07-07 ENCOUNTER — Other Ambulatory Visit: Payer: Self-pay

## 2019-07-07 DIAGNOSIS — M79662 Pain in left lower leg: Secondary | ICD-10-CM | POA: Diagnosis present

## 2019-08-30 ENCOUNTER — Ambulatory Visit
Admission: RE | Admit: 2019-08-30 | Discharge: 2019-08-30 | Disposition: A | Discharge status: Home / Self Care | Payer: Medicare HMO | Source: Ambulatory Visit | Attending: Internal Medicine | Admitting: Internal Medicine

## 2019-08-30 DIAGNOSIS — Z1231 Encounter for screening mammogram for malignant neoplasm of breast: Secondary | ICD-10-CM | POA: Diagnosis present

## 2020-02-12 ENCOUNTER — Other Ambulatory Visit: Payer: Self-pay

## 2020-02-12 ENCOUNTER — Other Ambulatory Visit: Payer: Medicare HMO

## 2020-04-17 ENCOUNTER — Other Ambulatory Visit: Payer: Self-pay | Admitting: Internal Medicine

## 2020-04-17 DIAGNOSIS — Z1231 Encounter for screening mammogram for malignant neoplasm of breast: Secondary | ICD-10-CM

## 2020-05-17 ENCOUNTER — Other Ambulatory Visit: Payer: Self-pay | Admitting: Orthopedic Surgery

## 2020-05-22 ENCOUNTER — Encounter
Admission: RE | Admit: 2020-05-22 | Discharge: 2020-05-22 | Disposition: A | Discharge status: Home / Self Care | Payer: Medicare HMO | Source: Ambulatory Visit | Attending: Orthopedic Surgery | Admitting: Orthopedic Surgery

## 2020-05-22 ENCOUNTER — Other Ambulatory Visit: Payer: Self-pay

## 2020-05-22 HISTORY — DX: Sarcoidosis, unspecified: D86.9

## 2020-05-22 HISTORY — DX: Type 2 diabetes mellitus without complications: E11.9

## 2020-05-22 HISTORY — DX: Personal history of urinary calculi: Z87.442

## 2020-05-22 HISTORY — DX: Essential (primary) hypertension: I10

## 2020-05-22 HISTORY — DX: Spinal stenosis, cervical region: M48.02

## 2020-05-22 HISTORY — DX: Other cervical disc degeneration, unspecified cervical region: M50.30

## 2020-05-22 HISTORY — DX: Other intervertebral disc degeneration, thoracic region: M51.34

## 2020-05-22 NOTE — Patient Instructions (Addendum)
Your procedure is scheduled on: Tuesday, March 22 Report to the Registration Desk on the 1st floor of the [facility]. To find out your arrival time, please call [phone] between 1PM - 3PM on: Monday, March 21  REMEMBER: Instructions that are not followed completely may result in serious medical risk, up to and including death; or upon the discretion of your surgeon and anesthesiologist your surgery may need to be rescheduled.  Do not eat food after midnight the night before surgery.  No gum chewing, lozengers or hard candies.  You may however, drink water up to 2 hours before you are scheduled to arrive for your surgery. Do not drink anything within 2 hours of your scheduled arrival time.  TAKE THESE MEDICATIONS THE MORNING OF SURGERY WITH A SIP OF WATER:  1.  Lansoprazole (Prevacid) - (take one the night before and one on the morning of surgery - helps to prevent nausea after surgery.) 2.  Timolol eye drop  One week prior to surgery: starting today, March 16 Stop Anti-inflammatories (NSAIDS) such as Advil, Aleve, Ibuprofen, Motrin, Naproxen, Naprosyn and Aspirin based products such as Excedrin, Goodys Powder, BC Powder. Stop ANY OVER THE COUNTER supplements until after surgery.  No Alcohol for 24 hours before or after surgery.  No Smoking including e-cigarettes for 24 hours prior to surgery.  No chewable tobacco products for at least 6 hours prior to surgery.  No nicotine patches on the day of surgery.  Do not use any "recreational" drugs for at least a week prior to your surgery.  Please be advised that the combination of cocaine and anesthesia may have negative outcomes, up to and including death. If you test positive for cocaine, your surgery will be cancelled.  On the morning of surgery brush your teeth with toothpaste and water, you may rinse your mouth with mouthwash if you wish. Do not swallow any toothpaste or mouthwash.  Do not wear jewelry, make-up, hairpins,  clips or nail polish.  Do not wear lotions, powders, or perfumes.   Do not shave body from the neck down 48 hours prior to surgery just in case you cut yourself which could leave a site for infection.  Also, freshly shaved skin may become irritated if using the CHG soap.  Contact lenses, hearing aids and dentures may not be worn into surgery.  Do not bring valuables to the hospital. [facility] is not responsible for any missing/lost belongings or valuables.   Use CHG Soap as directed on instruction sheet.  Notify your doctor if there is any change in your medical condition (cold, fever, infection).  Wear comfortable clothing (specific to your surgery type) to the hospital.  Plan for stool softeners for home use; pain medications have a tendency to cause constipation. You can also help prevent constipation by eating foods high in fiber such as fruits and vegetables and drinking plenty of fluids as your diet allows.  After surgery, you can help prevent lung complications by doing breathing exercises.  Take deep breaths and cough every 1-2 hours. Your doctor may order a device called an Incentive Spirometer to help you take deep breaths.  If you are being discharged the day of surgery, you will not be allowed to drive home. You will need a responsible adult (18 years or older) to drive you home and stay with you that night.   If you are taking public transportation, you will need to have a responsible adult (18 years or older) with you. Please  confirm with your physician that it is acceptable to use public transportation.   Please call the [place] Dept. at [phone] if you have any questions about these instructions.  Surgery Visitation Policy:  Patients undergoing a surgery or procedure may have one family member or support person with them as long as that person is not COVID-19 positive or experiencing its symptoms.  That person may remain in the waiting area  during the procedure.

## 2020-05-24 ENCOUNTER — Other Ambulatory Visit: Payer: Self-pay

## 2020-05-24 ENCOUNTER — Encounter
Admission: RE | Admit: 2020-05-24 | Discharge: 2020-05-24 | Disposition: A | Discharge status: Home / Self Care | Payer: Medicare HMO | Source: Ambulatory Visit | Attending: Orthopedic Surgery | Admitting: Orthopedic Surgery

## 2020-05-24 ENCOUNTER — Other Ambulatory Visit: Payer: Medicare HMO

## 2020-05-24 DIAGNOSIS — I10 Essential (primary) hypertension: Secondary | ICD-10-CM | POA: Diagnosis not present

## 2020-05-24 DIAGNOSIS — Z01818 Encounter for other preprocedural examination: Secondary | ICD-10-CM | POA: Diagnosis present

## 2020-05-24 DIAGNOSIS — E119 Type 2 diabetes mellitus without complications: Secondary | ICD-10-CM | POA: Insufficient documentation

## 2020-05-24 DIAGNOSIS — Z20822 Contact with and (suspected) exposure to covid-19: Secondary | ICD-10-CM | POA: Diagnosis not present

## 2020-05-25 LAB — SARS CORONAVIRUS 2 (TAT 6-24 HRS): SARS Coronavirus 2: NEGATIVE

## 2020-05-28 ENCOUNTER — Encounter
Admission: RE | Disposition: A | Discharge status: Home / Self Care | Payer: Self-pay | Source: Home / Self Care | Attending: Orthopedic Surgery

## 2020-05-28 ENCOUNTER — Other Ambulatory Visit: Payer: Self-pay

## 2020-05-28 ENCOUNTER — Ambulatory Visit
Admission: RE | Admit: 2020-05-28 | Discharge: 2020-05-28 | Disposition: A | Discharge status: Home / Self Care | Payer: Medicare HMO | Attending: Orthopedic Surgery | Admitting: Orthopedic Surgery

## 2020-05-28 ENCOUNTER — Ambulatory Visit: Payer: Medicare HMO | Admitting: Anesthesiology

## 2020-05-28 ENCOUNTER — Encounter: Payer: Self-pay | Admitting: Orthopedic Surgery

## 2020-05-28 DIAGNOSIS — Z7984 Long term (current) use of oral hypoglycemic drugs: Secondary | ICD-10-CM | POA: Insufficient documentation

## 2020-05-28 DIAGNOSIS — Z803 Family history of malignant neoplasm of breast: Secondary | ICD-10-CM | POA: Diagnosis not present

## 2020-05-28 DIAGNOSIS — Z8249 Family history of ischemic heart disease and other diseases of the circulatory system: Secondary | ICD-10-CM | POA: Insufficient documentation

## 2020-05-28 DIAGNOSIS — E119 Type 2 diabetes mellitus without complications: Secondary | ICD-10-CM | POA: Insufficient documentation

## 2020-05-28 DIAGNOSIS — Z79899 Other long term (current) drug therapy: Secondary | ICD-10-CM | POA: Diagnosis not present

## 2020-05-28 DIAGNOSIS — G5603 Carpal tunnel syndrome, bilateral upper limbs: Secondary | ICD-10-CM | POA: Diagnosis present

## 2020-05-28 HISTORY — PX: CARPAL TUNNEL RELEASE: SHX101

## 2020-05-28 LAB — GLUCOSE, CAPILLARY
Glucose-Capillary: 98 mg/dL (ref 70–99)
Glucose-Capillary: 84 mg/dL (ref 70–99)

## 2020-05-28 SURGERY — CARPAL TUNNEL RELEASE
Anesthesia: General | Laterality: Left

## 2020-05-28 MED ORDER — GLYCOPYRROLATE 0.2 MG/ML IJ SOLN
INTRAMUSCULAR | Status: DC | PRN
  Administered 2020-05-28: .2 mg via INTRAVENOUS

## 2020-05-28 MED ORDER — PROMETHAZINE HCL 25 MG/ML IJ SOLN
6.2500 mg | INTRAMUSCULAR | Status: DC | PRN
Start: 2020-05-28 — End: 2020-05-28

## 2020-05-28 MED ORDER — FENTANYL CITRATE (PF) 100 MCG/2ML IJ SOLN
INTRAMUSCULAR | Status: DC | PRN
  Administered 2020-05-28 (×2): 25 ug via INTRAVENOUS

## 2020-05-28 MED ORDER — DEXAMETHASONE SODIUM PHOSPHATE 10 MG/ML IJ SOLN
INTRAMUSCULAR | Status: DC | PRN
  Administered 2020-05-28: 10 mg via INTRAVENOUS

## 2020-05-28 MED ORDER — CEFAZOLIN SODIUM-DEXTROSE 2-4 GM/100ML-% IV SOLN
2.0000 g | INTRAVENOUS | Status: AC
  Administered 2020-05-28: 2 g via INTRAVENOUS

## 2020-05-28 MED ORDER — FENTANYL CITRATE (PF) 100 MCG/2ML IJ SOLN
INTRAMUSCULAR | Status: AC
  Filled 2020-05-28: qty 2

## 2020-05-28 MED ORDER — PROPOFOL 10 MG/ML IV BOLUS
INTRAVENOUS | Status: DC | PRN
  Administered 2020-05-28: 150 mg via INTRAVENOUS

## 2020-05-28 MED ORDER — CHLORHEXIDINE GLUCONATE 0.12 % MT SOLN: 15.0000 mL | Freq: Once | OROMUCOSAL | Status: AC

## 2020-05-28 MED ORDER — HYDROCODONE-ACETAMINOPHEN 5-325 MG PO TABS: 1.0000 | ORAL_TABLET | Freq: Four times a day (QID) | ORAL | 0 refills | Status: DC | PRN

## 2020-05-28 MED ORDER — ACETAMINOPHEN 10 MG/ML IV SOLN
INTRAVENOUS | Status: DC | PRN
  Administered 2020-05-28: 1000 mg via INTRAVENOUS

## 2020-05-28 MED ORDER — ACETAMINOPHEN 10 MG/ML IV SOLN
INTRAVENOUS | Status: AC
  Filled 2020-05-28: qty 100

## 2020-05-28 MED ORDER — FENTANYL CITRATE (PF) 100 MCG/2ML IJ SOLN: 25.0000 ug | INTRAMUSCULAR | Status: DC | PRN

## 2020-05-28 MED ORDER — CHLORHEXIDINE GLUCONATE 0.12 % MT SOLN
OROMUCOSAL | Status: AC
  Administered 2020-05-28: 15 mL via OROMUCOSAL
  Filled 2020-05-28: qty 15

## 2020-05-28 MED ORDER — SODIUM CHLORIDE 0.9 % IV SOLN: INTRAVENOUS | Status: DC

## 2020-05-28 MED ORDER — LIDOCAINE HCL (CARDIAC) PF 100 MG/5ML IV SOSY
PREFILLED_SYRINGE | INTRAVENOUS | Status: DC | PRN
  Administered 2020-05-28: 80 mg via INTRAVENOUS

## 2020-05-28 MED ORDER — CEFAZOLIN SODIUM-DEXTROSE 2-4 GM/100ML-% IV SOLN
INTRAVENOUS | Status: AC
  Filled 2020-05-28: qty 100

## 2020-05-28 MED ORDER — OXYCODONE HCL 5 MG PO TABS: 5.0000 mg | ORAL_TABLET | Freq: Once | ORAL | Status: DC | PRN

## 2020-05-28 MED ORDER — BUPIVACAINE HCL 0.5 % IJ SOLN
INTRAMUSCULAR | Status: DC | PRN
  Administered 2020-05-28: 10 mL

## 2020-05-28 MED ORDER — SODIUM BICARBONATE 8.4 % IV SOLN
INTRAVENOUS | Status: AC
  Filled 2020-05-28: qty 50

## 2020-05-28 MED ORDER — ORAL CARE MOUTH RINSE: 15.0000 mL | Freq: Once | OROMUCOSAL | Status: AC

## 2020-05-28 MED ORDER — OXYCODONE HCL 5 MG/5ML PO SOLN: 5.0000 mg | Freq: Once | ORAL | Status: DC | PRN

## 2020-05-28 MED ORDER — ONDANSETRON HCL 4 MG/2ML IJ SOLN
INTRAMUSCULAR | Status: DC | PRN
  Administered 2020-05-28: 4 mg via INTRAVENOUS

## 2020-05-28 MED ORDER — EPHEDRINE SULFATE 50 MG/ML IJ SOLN
INTRAMUSCULAR | Status: DC | PRN
  Administered 2020-05-28: 10 mg via INTRAVENOUS

## 2020-05-28 MED ORDER — MEPERIDINE HCL 50 MG/ML IJ SOLN
6.2500 mg | INTRAMUSCULAR | Status: DC | PRN
Start: 2020-05-28 — End: 2020-05-28

## 2020-05-28 SURGICAL SUPPLY — 25 items
APL PRP STRL LF DISP 70% ISPRP (MISCELLANEOUS) ×1
BNDG ELASTIC 3X5.8 VLCR STR LF (GAUZE/BANDAGES/DRESSINGS) ×2 IMPLANT
CANISTER SUCT 1200ML W/VALVE (MISCELLANEOUS) ×2 IMPLANT
CHLORAPREP W/TINT 26 (MISCELLANEOUS) ×2 IMPLANT
COVER WAND RF STERILE (DRAPES) ×2 IMPLANT
CUFF TOURN SGL QUICK 18X4 (TOURNIQUET CUFF) IMPLANT
ELECT CAUTERY NEEDLE 2.0 MIC (NEEDLE) IMPLANT
GAUZE SPONGE 4X4 12PLY STRL (GAUZE/BANDAGES/DRESSINGS) ×2 IMPLANT
GAUZE XEROFORM 1X8 LF (GAUZE/BANDAGES/DRESSINGS) ×2 IMPLANT
GLOVE SURG SYN 9.0  PF PI (GLOVE) ×1
GLOVE SURG SYN 9.0 PF PI (GLOVE) ×1 IMPLANT
GOWN SRG 2XL LVL 4 RGLN SLV (GOWNS) ×1 IMPLANT
GOWN STRL NON-REIN 2XL LVL4 (GOWNS) ×2
GOWN STRL REUS W/ TWL LRG LVL3 (GOWN DISPOSABLE) ×1 IMPLANT
GOWN STRL REUS W/TWL LRG LVL3 (GOWN DISPOSABLE) ×2
KIT TURNOVER KIT A (KITS) ×2 IMPLANT
MANIFOLD NEPTUNE II (INSTRUMENTS) ×2 IMPLANT
NS IRRIG 500ML POUR BTL (IV SOLUTION) ×2 IMPLANT
PACK EXTREMITY ARMC (MISCELLANEOUS) ×2 IMPLANT
PAD CAST CTTN 4X4 STRL (SOFTGOODS) ×1 IMPLANT
PADDING CAST COTTON 4X4 STRL (SOFTGOODS) ×2
SCALPEL PROTECTED #15 DISP (BLADE) ×4 IMPLANT
SUT ETHILON 4-0 (SUTURE) ×2
SUT ETHILON 4-0 FS2 18XMFL BLK (SUTURE) ×1
SUTURE ETHLN 4-0 FS2 18XMF BLK (SUTURE) ×1 IMPLANT

## 2020-05-28 NOTE — Transfer of Care (Signed)
Immediate Anesthesia Transfer of Care Note  Patient: Anna Castro  Procedure(s) Performed: CARPAL TUNNEL RELEASE (Left )  Patient Location: PACU  Anesthesia Type:General  Level of Consciousness: drowsy and patient cooperative  Airway & Oxygen Therapy: Patient Spontanous Breathing and Patient connected to face mask oxygen  Post-op Assessment: Report given to RN and Post -op Vital signs reviewed and stable  Post vital signs: Reviewed and stable  Last Vitals:  Vitals Value Taken Time  BP 143/63 05/28/20 1545  Temp    Pulse 80 05/28/20 1546  Resp 20 05/28/20 1546  SpO2 100 % 05/28/20 1546  Vitals shown include unvalidated device data.  Last Pain:  Vitals:   05/28/20 1336  TempSrc: Temporal  PainSc: 0-No pain      Patients Stated Pain Goal: 0 (03/55/97 4163)  Complications: No complications documented.

## 2020-05-28 NOTE — Anesthesia Procedure Notes (Signed)
Procedure Name: LMA Insertion Date/Time: 05/28/2020 3:13 PM Performed by: Hedda Slade, CRNA Pre-anesthesia Checklist: Patient identified, Emergency Drugs available, Patient being monitored and Suction available Patient Re-evaluated:Patient Re-evaluated prior to induction Oxygen Delivery Method: Circle system utilized Preoxygenation: Pre-oxygenation with 100% oxygen Induction Type: IV induction Ventilation: Mask ventilation without difficulty LMA: LMA inserted LMA Size: 3.5 Number of attempts: 1 Placement Confirmation: positive ETCO2 and breath sounds checked- equal and bilateral Tube secured with: Tape Dental Injury: Teeth and Oropharynx as per pre-operative assessment

## 2020-05-28 NOTE — Op Note (Signed)
05/28/2020  3:49 PM  PATIENT:  Anna Castro  70 y.o. female  PRE-OPERATIVE DIAGNOSIS:  Carpal tunnel syndrome, left upper limb G56.02  POST-OPERATIVE DIAGNOSIS:  Carpal tunnel syndrome, left upper limb G56.02  PROCEDURE:  Procedure(s): CARPAL TUNNEL RELEASE (Left)  SURGEON: Laurene Footman, MD  ASSISTANTS: None  ANESTHESIA:   general  EBL:  Total I/O In: 200 [IV Piggyback:200] Out: -   BLOOD ADMINISTERED:none  DRAINS: none   LOCAL MEDICATIONS USED:  MARCAINE     SPECIMEN:  No Specimen  DISPOSITION OF SPECIMEN:  N/A  COUNTS:  YES  TOURNIQUET:   Total Tourniquet Time Documented: Upper Arm (Left) - 9 minutes Total: Upper Arm (Left) - 9 minutes   IMPLANTS: None  DICTATION: .Dragon Dictation patient was brought to the operating room and after adequate general anesthesia was obtained the left arm was prepped and draped in usual sterile fashion.  After patient identification and timeout procedures were completed tourniquet was raised to her 50 mmHg.  Incision was made in line with the ring metacarpal through prior carpal tunnel release incision.  There was scar tissue present when this was spread the scar from the prior surgery was identified and there was a thick band approximately a centimeter wide that appeared to compress the nerve at the proximal end of the carpal tunnel.  After release of this and further release distally there is good vascular blush to the nerve and appeared no compression beyond this with fat noted around the nerve distally.  More proximally at the level of the wrist flexion crease there was no apparent compression and appeared adequate release had been carried out.  The wound was infiltrated with 10 cc of half percent Sensorcaine and the wound closed with simple erupted 4-0 nylon skin sutures.  Xeroform 4 x 4 web roll and Ace wrap applied tourniquet let down at the close of the case.  PLAN OF CARE: Discharge to home after PACU  PATIENT  DISPOSITION:  PACU - hemodynamically stable.

## 2020-05-28 NOTE — Anesthesia Postprocedure Evaluation (Signed)
Anesthesia Post Note  Patient: Anna Castro  Procedure(s) Performed: CARPAL TUNNEL RELEASE (Left )  Patient location during evaluation: PACU Anesthesia Type: General Level of consciousness: awake and alert Pain management: pain level controlled Vital Signs Assessment: post-procedure vital signs reviewed and stable Respiratory status: spontaneous breathing, nonlabored ventilation, respiratory function stable and patient connected to nasal cannula oxygen Cardiovascular status: blood pressure returned to baseline and stable Postop Assessment: no apparent nausea or vomiting Anesthetic complications: no   No complications documented.   Last Vitals:  Vitals:   05/28/20 1600 05/28/20 1613  BP: (!) 146/79 (!) 143/55  Pulse: 75 67  Resp: 15 14  Temp:  (!) 36.2 C  SpO2: 100% 100%    Last Pain:  Vitals:   05/28/20 1613  TempSrc:   PainSc: 0-No pain                 Precious Haws Kacyn Souder

## 2020-05-28 NOTE — Anesthesia Preprocedure Evaluation (Signed)
Anesthesia Evaluation  Patient identified by MRN, date of birth, ID band Patient awake    Reviewed: Allergy & Precautions, NPO status , Patient's Chart, lab work & pertinent test results  History of Anesthesia Complications Negative for: history of anesthetic complications  Airway Mallampati: II  TM Distance: >3 FB Neck ROM: Full    Dental  (+) Upper Dentures, Lower Dentures   Pulmonary neg pulmonary ROS, neg sleep apnea, neg COPD,    breath sounds clear to auscultation- rhonchi (-) wheezing      Cardiovascular hypertension, Pt. 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), thoracic No date: Diabetes mellitus without complication (HCC) No date: GERD (gastroesophageal reflux disease) No date: Heart murmur No date: History of kidney stones No date: Hyperlipidemia No date: Hypertension No date: Osteoporosis No date: Sarcoid No date: Spinal stenosis of cervical region   Reproductive/Obstetrics                             Anesthesia Physical Anesthesia Plan  ASA: II  Anesthesia Plan: General   Post-op Pain Management:    Induction: Intravenous  PONV Risk Score and Plan: 2 and Ondansetron and Dexamethasone  Airway Management Planned: LMA  Additional Equipment:   Intra-op Plan:   Post-operative Plan:   Informed Consent: I have reviewed the  patients History and Physical, chart, labs and discussed the procedure including the risks, benefits and alternatives for the proposed anesthesia with the patient or authorized representative who has indicated his/her understanding and acceptance.     Dental advisory given  Plan Discussed with: CRNA and Anesthesiologist  Anesthesia Plan Comments:         Anesthesia Quick Evaluation

## 2020-05-28 NOTE — H&P (Signed)
Chief Complaint  Patient presents with  . Follow-up  Bilateral carpal tunnel symdrome.    History of the Present Illness: Anna Castro is a 70 y.o. female here today.   The patient presents for evaluation of bilateral carpal tunnel syndrome. Bilateral nerve tests showed abnormal grade IV severe carpal tunnel syndrome.  The patient states her hands were very symptomatic last night. She states her left hand is more bothersome than her right.  The patient is not currently working.   I have reviewed past medical, surgical, social and family history, and allergies as documented in the EMR.  Past Medical History: Past Medical History:  Diagnosis Date  . Anemia  . Diabetes (CMS-HCC)  . GERD (gastroesophageal reflux disease)  . Hyperlipidemia  . Osteoporosis, post-menopausal  . Sarcoid  . Thyroid disease   Past Surgical History: Past Surgical History:  Procedure Laterality Date  . COLONOSCOPY 11/16/2012  02/19/1997, 09/16/2001, 10/08/2006. 1st degree relative with colorectal CA, repeat 5 years, Dr. Candace Cruise.  . COLONOSCOPY 01/19/2018  Tubular adenoma of the colon/Repeat 25yrs/TKT  . EGD 10/08/2006  . ENDOSCOPIC CARPAL TUNNEL RELEASE Left 2001  . ENDOSCOPIC CARPAL TUNNEL RELEASE Right 2007   Past Family History: Family History  Problem Relation Age of Onset  . Breast cancer Mother  . Coronary Artery Disease (Blocked arteries around heart) Father   Medications: Current Outpatient Medications Ordered in Epic  Medication Sig Dispense Refill  . acetaminophen (TYLENOL) 500 MG tablet Take 1,000 mg by mouth as needed for Pain  . alendronate (FOSAMAX) 70 MG tablet TAKE 1 TABLET BY MOUTH EVERY 7 DAYS WITH FULL GLASS OF WATER. DONT LIE DOWN FOR THE NEXT 30 MINS 12 tablet 3  . bimatoprost (LUMIGAN) 0.01 % ophthalmic solution 1 drop nightly.  . cholecalciferol (VITAMIN D3) 1,250 mcg (50,000 unit) capsule Take 50,000 Units by mouth once a week  . glimepiride (AMARYL) 4 MG tablet TAKE 1  TABLET BY MOUTH DAILY WITH BREAKFAST 90 tablet 3  . lansoprazole (PREVACID) 30 MG DR capsule Take by mouth  . losartan (COZAAR) 100 MG tablet Take 1 tablet (100 mg total) by mouth once daily 90 tablet 3  . rosuvastatin (CRESTOR) 10 MG tablet TAKE 1 TABLET BY MOUTH EVERY DAY 90 tablet 3  . timoloL maleate (TIMOPTIC) 0.5 % ophthalmic solution Place 1 drop into both eyes 2 (two) times daily  . ergocalciferol, vitamin D2, 1,250 mcg (50,000 unit) capsule TAKE 1 CAPSULE BY MOUTH ONE TIME PER WEEK (Patient not taking: Reported on 05/15/2020) 12 capsule 0   No current Epic-ordered facility-administered medications on file.   Allergies: No Known Allergies   Body mass index is 34.02 kg/m.  Review of Systems: A comprehensive 14 point ROS was performed, reviewed, and the pertinent orthopaedic findings are documented in the HPI.  Vitals:  05/15/20 0927  BP: 134/70    General Physical Examination:   General/Constitutional: No apparent distress: well-nourished and well developed. Eyes: Pupils equal, round with synchronous movement. Lungs: Clear to auscultation HEENT: Normal, remarkable for full upper and lower dentures. Vascular: No edema, swelling or tenderness, except as noted in detailed exam. Cardiac: Heart rate and rhythm is regular. Integumentary: No impressive skin lesions present, except as noted in detailed exam. Neuro/Psych: Normal mood and affect, oriented to person, place and time.  Musculoskeletal Examination:  On exam, bilateral hand atrophy. Positive Tinel sign bilaterally. Left hand is more symptomatic than the right.   Radiographs:  No results were obtained or interpreted today.  Assessment: ICD-10-CM  1. Carpal tunnel syndrome, bilateral upper limbs G56.03   Plan:  The patient has clinical findings of severe bilateral carpal tunnel syndrome, left worse than right.  We discussed the patient's prior EMG nerve conduction study findings. I explained she has severe  bilateral carpal tunnel syndrome. I recommend left carpal tunnel release. I explained the surgery and postoperative course in detail. The patient viewed the [facility] video on carpal tunnel.   We will plan for left carpal tunnel release in the hospital in the near future.  Surgical Risks:  The nature of the condition and the proposed procedure has been reviewed in detail with the patient. Surgical versus non-surgical options and prognosis for recovery have been reviewed and the inherent risks and benefits of each have been discussed including the risks of infection, bleeding, injury to nerves/blood vessels/tendons, incomplete relief of symptoms, persisting pain and/or stiffness, loss of function, complex regional pain syndrome, failure of the procedure, as appropriate.  Teeth: Full upper and lower dentures.  Attestation: I, Dawn Royse, am documenting for [employer], MD utilizing [place].   Reviewed  H+P. No changes noted.

## 2020-05-28 NOTE — Discharge Instructions (Addendum)
Loosen Ace wrap prior to dismissal and if fingers swell. Pain medicine as directed. Keep arm elevated. Work on finger motion is much as you can.  AMBULATORY SURGERY  DISCHARGE INSTRUCTIONS   1) The drugs that you were given will stay in your system until tomorrow so for the next 24 hours you should not:  A) Drive an automobile B) Make any legal decisions C) Drink any alcoholic beverage   2) You may resume regular meals tomorrow.  Today it is better to start with liquids and gradually work up to solid foods.  You may eat anything you prefer, but it is better to start with liquids, then soup and crackers, and gradually work up to solid foods.   3) Please notify your doctor immediately if you have any unusual bleeding, trouble breathing, redness and pain at the surgery site, drainage, fever, or pain not relieved by medication.    4) Additional Instructions: May take plain tylenol 500mg  every 8 hours alternately with ibuprofen 800mg  every 8 hours.  So you will take one every 4 hours.  Start at 9:00  Please contact your physician with any problems or Same Day Surgery at [phone], Monday through Friday 6 am to 4 pm, or [place] at [facility] number at [phone].

## 2020-05-29 ENCOUNTER — Encounter: Payer: Self-pay | Admitting: Orthopedic Surgery

## 2020-09-02 ENCOUNTER — Other Ambulatory Visit: Payer: Self-pay

## 2020-09-02 ENCOUNTER — Ambulatory Visit
Admission: RE | Admit: 2020-09-02 | Discharge: 2020-09-02 | Disposition: A | Discharge status: Home / Self Care | Payer: Medicare HMO | Source: Ambulatory Visit | Attending: Internal Medicine | Admitting: Internal Medicine

## 2020-09-02 DIAGNOSIS — Z1231 Encounter for screening mammogram for malignant neoplasm of breast: Secondary | ICD-10-CM | POA: Diagnosis not present

## 2021-05-01 ENCOUNTER — Other Ambulatory Visit: Payer: Self-pay | Admitting: Internal Medicine

## 2021-05-01 DIAGNOSIS — Z1231 Encounter for screening mammogram for malignant neoplasm of breast: Secondary | ICD-10-CM

## 2021-05-16 ENCOUNTER — Other Ambulatory Visit: Payer: Self-pay | Admitting: Orthopedic Surgery

## 2021-05-21 ENCOUNTER — Inpatient Hospital Stay: Admission: RE | Admit: 2021-05-21 | Payer: Medicare HMO | Source: Ambulatory Visit

## 2021-05-22 ENCOUNTER — Inpatient Hospital Stay
Admission: RE | Admit: 2021-05-22 | Discharge: 2021-05-22 | Disposition: A | Discharge status: Home / Self Care | Payer: Medicare HMO | Source: Ambulatory Visit

## 2021-05-22 NOTE — Pre-Procedure Instructions (Signed)
Called pt at both numbers for the past 2 days to complete her anesthesia interview and I have had to leave messages on both numbers. I have given her my call back number but pt still has not called back. I reached out to [facility] at Dr Theodore Demark office informing her of this and I had to leave her a message. I told her we will not try this pt again so her phone interview is not done unless she can get in touch with the pt to let her know she has to call us back ?

## 2021-05-23 ENCOUNTER — Inpatient Hospital Stay
Admission: RE | Admit: 2021-05-23 | Discharge: 2021-05-23 | Disposition: A | Discharge status: Home / Self Care | Payer: Medicare HMO | Source: Ambulatory Visit

## 2021-05-23 NOTE — Pre-Procedure Instructions (Signed)
Left message for Mitchell Heir again today at Dr Theodore Demark office informing her that I called pt again today for the 3rd day in a row on both numbers provided [phone] and [phone] and had to leave messages again for the pt. I left message with Mitchell Heir that we are not calling this pt anymore and she will have to have labs and ekg done the day of surgery.  ?

## 2021-05-27 ENCOUNTER — Other Ambulatory Visit: Payer: Self-pay

## 2021-05-27 ENCOUNTER — Encounter
Admission: RE | Admit: 2021-05-27 | Discharge: 2021-05-27 | Disposition: A | Discharge status: Home / Self Care | Payer: Medicare HMO | Source: Ambulatory Visit | Attending: Orthopedic Surgery | Admitting: Orthopedic Surgery

## 2021-05-27 NOTE — Patient Instructions (Signed)
Your procedure is scheduled on: 05/29/21 Report to [place]. To find out your arrival time please call [phone] between 1PM - 3PM on 05/28/21.  Remember: Instructions that are not followed completely may result in serious medical risk, up to and including death, or upon the discretion of your surgeon and anesthesiologist your surgery may need to be rescheduled.     _X__ 1. Do not eat food after midnight the night before your procedure.                 No gum chewing or hard candies. You may drink clear liquids up to 2 hours                 before you are scheduled to arrive for your surgery- DO not drink clear                 liquids within 2 hours of the start of your surgery.                 Clear Liquids include:  Marland Kitchen Diabetics water only  __X__2.  On the morning of surgery brush your teeth with toothpaste and water, you                 may rinse your mouth with mouthwash if you wish.  Do not swallow any              toothpaste of mouthwash.     _X__ 3.  No Alcohol for 24 hours before or after surgery.   _X__ 4.  Do Not Smoke or use e-cigarettes For 24 Hours Prior to Your Surgery.                 Do not use any chewable tobacco products for at least 6 hours prior to                 surgery.  ____  5.  Bring all medications with you on the day of surgery if instructed.   __X__  6.  Notify your doctor if there is any change in your medical condition      (cold, fever, infections).     Do not wear jewelry, make-up, hairpins, clips or nail polish. Do not wear lotions, powders, or perfumes.  Do not shave 48 hours prior to surgery. Men may shave face and neck. Do not bring valuables to the hospital.    [facility] is not responsible for any belongings or valuables.  Contacts, dentures/partials or body piercings may not be worn into surgery. Bring a case for your contacts, glasses or hearing aids, a denture cup will be  supplied. Leave your suitcase in the car. After surgery it may be brought to your room. For patients admitted to the hospital, discharge time is determined by your treatment team.   Patients discharged the day of surgery will not be allowed to drive home.   Please read over the following fact sheets that you were given:   CHG soap  __X__ Take these medicines the morning of surgery with A SIP OF WATER:    1. lansoprazole (PREVACID) 30 MG capsule  2. rosuvastatin (CRESTOR) 10 MG tablet  3.   4.  5.  6.  ____ Fleet Enema (as directed)   __X__ Use CHG Soap/SAGE wipes as directed  ____ Use inhalers on the day of surgery  ____ Stop metformin/Janumet/Farxiga 2 days prior to surgery  ____ Take 1/2 of usual insulin dose the night before surgery. No insulin the morning          of surgery.   ____ Stop Blood Thinners Coumadin/Plavix/Xarelto/Pleta/Pradaxa/Eliquis/Effient/Aspirin  on   Or contact your Surgeon, Cardiologist or Medical Doctor regarding  ability to stop your blood thinners  __X__ Stop Anti-inflammatories 7 days before surgery such as Advil, Ibuprofen, Motrin,  BC or Goodies Powder, Naprosyn, Naproxen, Aleve, Aspirin    __X__ Stop all herbals and supplements, fish oil or vitamins  until after surgery.    ____ Bring C-Pap to the hospital.

## 2021-05-28 ENCOUNTER — Encounter: Payer: Self-pay | Admitting: Urgent Care

## 2021-05-28 ENCOUNTER — Other Ambulatory Visit
Admission: RE | Admit: 2021-05-28 | Discharge: 2021-05-28 | Disposition: A | Discharge status: Home / Self Care | Payer: Medicare HMO | Source: Ambulatory Visit | Attending: Orthopedic Surgery | Admitting: Orthopedic Surgery

## 2021-05-28 DIAGNOSIS — Z01818 Encounter for other preprocedural examination: Secondary | ICD-10-CM | POA: Insufficient documentation

## 2021-05-28 DIAGNOSIS — E119 Type 2 diabetes mellitus without complications: Secondary | ICD-10-CM | POA: Diagnosis not present

## 2021-05-28 DIAGNOSIS — Z79899 Other long term (current) drug therapy: Secondary | ICD-10-CM | POA: Insufficient documentation

## 2021-05-28 DIAGNOSIS — I10 Essential (primary) hypertension: Secondary | ICD-10-CM | POA: Insufficient documentation

## 2021-05-28 DIAGNOSIS — Z01810 Encounter for preprocedural cardiovascular examination: Secondary | ICD-10-CM | POA: Diagnosis not present

## 2021-05-28 LAB — POTASSIUM: Potassium: 3.9 mmol/L (ref 3.5–5.1)

## 2021-05-29 ENCOUNTER — Encounter
Admission: RE | Disposition: A | Discharge status: Home / Self Care | Payer: Self-pay | Source: Home / Self Care | Attending: Orthopedic Surgery

## 2021-05-29 ENCOUNTER — Other Ambulatory Visit: Payer: Self-pay

## 2021-05-29 ENCOUNTER — Encounter: Payer: Self-pay | Admitting: Orthopedic Surgery

## 2021-05-29 ENCOUNTER — Other Ambulatory Visit: Payer: Medicare HMO

## 2021-05-29 ENCOUNTER — Ambulatory Visit: Payer: Medicare HMO | Admitting: Certified Registered"

## 2021-05-29 ENCOUNTER — Ambulatory Visit: Payer: Medicare HMO | Admitting: Anesthesiology

## 2021-05-29 ENCOUNTER — Ambulatory Visit
Admission: RE | Admit: 2021-05-29 | Discharge: 2021-05-29 | Disposition: A | Discharge status: Home / Self Care | Payer: Medicare HMO | Attending: Orthopedic Surgery | Admitting: Orthopedic Surgery

## 2021-05-29 DIAGNOSIS — Z7984 Long term (current) use of oral hypoglycemic drugs: Secondary | ICD-10-CM | POA: Diagnosis not present

## 2021-05-29 DIAGNOSIS — K219 Gastro-esophageal reflux disease without esophagitis: Secondary | ICD-10-CM | POA: Diagnosis not present

## 2021-05-29 DIAGNOSIS — E785 Hyperlipidemia, unspecified: Secondary | ICD-10-CM | POA: Insufficient documentation

## 2021-05-29 DIAGNOSIS — I10 Essential (primary) hypertension: Secondary | ICD-10-CM | POA: Diagnosis not present

## 2021-05-29 DIAGNOSIS — E119 Type 2 diabetes mellitus without complications: Secondary | ICD-10-CM | POA: Insufficient documentation

## 2021-05-29 DIAGNOSIS — G5601 Carpal tunnel syndrome, right upper limb: Secondary | ICD-10-CM | POA: Insufficient documentation

## 2021-05-29 DIAGNOSIS — Z79899 Other long term (current) drug therapy: Secondary | ICD-10-CM | POA: Diagnosis not present

## 2021-05-29 HISTORY — PX: CARPAL TUNNEL RELEASE: SHX101

## 2021-05-29 LAB — GLUCOSE, CAPILLARY
Glucose-Capillary: 135 mg/dL — ABNORMAL HIGH (ref 70–99)
Glucose-Capillary: 104 mg/dL — ABNORMAL HIGH (ref 70–99)

## 2021-05-29 SURGERY — CARPAL TUNNEL RELEASE
Anesthesia: General | Laterality: Right

## 2021-05-29 MED ORDER — METOCLOPRAMIDE HCL 10 MG PO TABS: 5.0000 mg | ORAL_TABLET | Freq: Three times a day (TID) | ORAL | Status: DC | PRN

## 2021-05-29 MED ORDER — SODIUM CHLORIDE 0.9 % IV SOLN: INTRAVENOUS | Status: DC

## 2021-05-29 MED ORDER — DEXAMETHASONE SODIUM PHOSPHATE 10 MG/ML IJ SOLN
INTRAMUSCULAR | Status: AC
  Filled 2021-05-29: qty 1

## 2021-05-29 MED ORDER — 0.9 % SODIUM CHLORIDE (POUR BTL) OPTIME
TOPICAL | Status: DC | PRN
  Administered 2021-05-29: 50 mL

## 2021-05-29 MED ORDER — CEFAZOLIN SODIUM-DEXTROSE 2-4 GM/100ML-% IV SOLN
INTRAVENOUS | Status: AC
  Filled 2021-05-29: qty 100

## 2021-05-29 MED ORDER — EPHEDRINE 5 MG/ML INJ
INTRAVENOUS | Status: AC
  Filled 2021-05-29: qty 5

## 2021-05-29 MED ORDER — FENTANYL CITRATE (PF) 100 MCG/2ML IJ SOLN: 25.0000 ug | INTRAMUSCULAR | Status: DC | PRN

## 2021-05-29 MED ORDER — LIDOCAINE HCL (CARDIAC) PF 100 MG/5ML IV SOSY
PREFILLED_SYRINGE | INTRAVENOUS | Status: DC | PRN
  Administered 2021-05-29: 80 mg via INTRAVENOUS

## 2021-05-29 MED ORDER — EPHEDRINE SULFATE (PRESSORS) 50 MG/ML IJ SOLN
INTRAMUSCULAR | Status: DC | PRN
  Administered 2021-05-29: 5 mg via INTRAVENOUS

## 2021-05-29 MED ORDER — CHLORHEXIDINE GLUCONATE 0.12 % MT SOLN
OROMUCOSAL | Status: AC
  Administered 2021-05-29: 15 mL via OROMUCOSAL
  Filled 2021-05-29: qty 15

## 2021-05-29 MED ORDER — ONDANSETRON HCL 4 MG/2ML IJ SOLN: 4.0000 mg | Freq: Four times a day (QID) | INTRAMUSCULAR | Status: DC | PRN

## 2021-05-29 MED ORDER — CHLORHEXIDINE GLUCONATE 0.12 % MT SOLN: 15.0000 mL | Freq: Once | OROMUCOSAL | Status: AC

## 2021-05-29 MED ORDER — PROPOFOL 10 MG/ML IV BOLUS
INTRAVENOUS | Status: DC | PRN
  Administered 2021-05-29: 30 mg via INTRAVENOUS
  Administered 2021-05-29: 130 mg via INTRAVENOUS

## 2021-05-29 MED ORDER — FENTANYL CITRATE (PF) 100 MCG/2ML IJ SOLN
INTRAMUSCULAR | Status: AC
  Filled 2021-05-29: qty 2

## 2021-05-29 MED ORDER — METOCLOPRAMIDE HCL 5 MG/ML IJ SOLN: 5.0000 mg | Freq: Three times a day (TID) | INTRAMUSCULAR | Status: DC | PRN

## 2021-05-29 MED ORDER — ACETAMINOPHEN 10 MG/ML IV SOLN
INTRAVENOUS | Status: AC
  Filled 2021-05-29: qty 100

## 2021-05-29 MED ORDER — FENTANYL CITRATE (PF) 100 MCG/2ML IJ SOLN
INTRAMUSCULAR | Status: DC | PRN
  Administered 2021-05-29: 25 ug via INTRAVENOUS

## 2021-05-29 MED ORDER — ACETAMINOPHEN 10 MG/ML IV SOLN
INTRAVENOUS | Status: DC | PRN
  Administered 2021-05-29: 1000 mg via INTRAVENOUS

## 2021-05-29 MED ORDER — BUPIVACAINE HCL (PF) 0.5 % IJ SOLN
INTRAMUSCULAR | Status: AC
  Filled 2021-05-29: qty 30

## 2021-05-29 MED ORDER — CEFAZOLIN SODIUM-DEXTROSE 2-4 GM/100ML-% IV SOLN
2.0000 g | INTRAVENOUS | Status: AC
  Administered 2021-05-29: 2 g via INTRAVENOUS

## 2021-05-29 MED ORDER — ONDANSETRON HCL 4 MG/2ML IJ SOLN: 4.0000 mg | Freq: Once | INTRAMUSCULAR | Status: DC | PRN

## 2021-05-29 MED ORDER — ONDANSETRON HCL 4 MG/2ML IJ SOLN
INTRAMUSCULAR | Status: AC
Start: 2021-05-29 — End: ?
  Filled 2021-05-29: qty 2

## 2021-05-29 MED ORDER — BUPIVACAINE HCL 0.5 % IJ SOLN
INTRAMUSCULAR | Status: DC | PRN
Start: 2021-05-29 — End: 2021-05-29
  Administered 2021-05-29: 10 mL

## 2021-05-29 MED ORDER — HYDROCODONE-ACETAMINOPHEN 5-325 MG PO TABS: 1.0000 | ORAL_TABLET | ORAL | 0 refills | Status: AC | PRN

## 2021-05-29 MED ORDER — ORAL CARE MOUTH RINSE: 15.0000 mL | Freq: Once | OROMUCOSAL | Status: AC

## 2021-05-29 MED ORDER — ONDANSETRON HCL 4 MG/2ML IJ SOLN
INTRAMUSCULAR | Status: DC | PRN
  Administered 2021-05-29: 4 mg via INTRAVENOUS

## 2021-05-29 MED ORDER — ONDANSETRON HCL 4 MG PO TABS
4.0000 mg | ORAL_TABLET | Freq: Four times a day (QID) | ORAL | Status: DC | PRN
Start: 2021-05-29 — End: 2021-05-29

## 2021-05-29 MED ORDER — LACTATED RINGERS IV SOLN: INTRAVENOUS | Status: DC | PRN

## 2021-05-29 MED ORDER — DEXAMETHASONE SODIUM PHOSPHATE 10 MG/ML IJ SOLN
INTRAMUSCULAR | Status: DC | PRN
  Administered 2021-05-29: 5 mg via INTRAVENOUS

## 2021-05-29 SURGICAL SUPPLY — 25 items
APL PRP STRL LF DISP 70% ISPRP (MISCELLANEOUS) ×1
BNDG ELASTIC 3X5.8 VLCR STR LF (GAUZE/BANDAGES/DRESSINGS) ×2 IMPLANT
CHLORAPREP W/TINT 26 (MISCELLANEOUS) ×2 IMPLANT
CUFF TOURN SGL QUICK 18X4 (TOURNIQUET CUFF) IMPLANT
ELECT CAUTERY NDL 2.0 MIC (NEEDLE) IMPLANT
ELECT CAUTERY NEEDLE 2.0 MIC (NEEDLE) IMPLANT
GAUZE SPONGE 4X4 12PLY STRL (GAUZE/BANDAGES/DRESSINGS) ×2 IMPLANT
GAUZE XEROFORM 1X8 LF (GAUZE/BANDAGES/DRESSINGS) ×2 IMPLANT
GLOVE SURG SYN 9.0  PF PI (GLOVE) ×2
GLOVE SURG SYN 9.0 PF PI (GLOVE) ×1 IMPLANT
GOWN SRG 2XL LVL 4 RGLN SLV (GOWNS) ×1 IMPLANT
GOWN STRL NON-REIN 2XL LVL4 (GOWNS) ×2
GOWN STRL REUS W/ TWL LRG LVL3 (GOWN DISPOSABLE) ×1 IMPLANT
GOWN STRL REUS W/TWL LRG LVL3 (GOWN DISPOSABLE) ×2
KIT TURNOVER KIT A (KITS) ×2 IMPLANT
MANIFOLD NEPTUNE II (INSTRUMENTS) ×2 IMPLANT
NS IRRIG 500ML POUR BTL (IV SOLUTION) ×2 IMPLANT
PACK EXTREMITY ARMC (MISCELLANEOUS) ×2 IMPLANT
PAD CAST CTTN 4X4 STRL (SOFTGOODS) ×1 IMPLANT
PADDING CAST COTTON 4X4 STRL (SOFTGOODS) ×2
SCALPEL PROTECTED #15 DISP (BLADE) ×4 IMPLANT
SUT ETHILON 4-0 (SUTURE) ×2
SUT ETHILON 4-0 FS2 18XMFL BLK (SUTURE) ×1
SUTURE ETHLN 4-0 FS2 18XMF BLK (SUTURE) ×1 IMPLANT
WATER STERILE IRR 500ML POUR (IV SOLUTION) ×2 IMPLANT

## 2021-05-29 NOTE — Op Note (Signed)
05/29/2021 ? ?2:17 PM ? ?PATIENT:  Anna Castro  71 y.o. female ? ?PRE-OPERATIVE DIAGNOSIS:  Right carpal tunnel syndrome G56.01 ? ?POST-OPERATIVE DIAGNOSIS:  Right carpal tunnel syndrome G56.01 ? ?PROCEDURE:  Procedure(s): ?Right carpal tunnel release (Right) ? ?SURGEON: Laurene Footman, MD ? ?ASSISTANTS: None ? ?ANESTHESIA:   general ? ?EBL:  Total I/O ?In: 400 [I.V.:200; IV Piggyback:200] ?Out: 1 [Blood:1] ? ?BLOOD ADMINISTERED:none ? ?DRAINS: none  ? ?LOCAL MEDICATIONS USED:  MARCAINE    ? ?SPECIMEN:  No Specimen ? ?DISPOSITION OF SPECIMEN:  N/A ? ?COUNTS:  YES ? ?TOURNIQUET:   ?Total Tourniquet Time Documented: ?Forearm (Right) - 5 minutes ?Total: Forearm (Right) - 5 minutes ? ? ?IMPLANTS: None ? ?DICTATION: .Dragon Dictation patient brought to the operating room and after adequate general anesthesia was obtained the right arm was prepped and draped in the usual sterile fashion.  After patient identification and timeout procedures tourniquet was raised.  The tourniquet was at the proximal forearm.  Incision was made in line of the ring metacarpal approximately 2 cm in length.  Skin and subcutaneous tissue was spread and the transverse carpal ligament identified and incised.  A vascular hemostat was placed deep to this to protect the underlying structures and released carried out distally to there is fat noted around the nerve and then proximally about a centimeter proximal to the wrist flexion crease.  There is good vascular blush to the nerve and evidence of compression in the mid carpal tunnel.  The wound was let down and then the wound irrigated with 10 cc of half percent Sensorcaine infiltrated in the incision.  Wound was closed with simple erupted 4-0 nylon skin sutures.  Xeroform 4 x 4 Borello Ace wrap applied. ? ?PLAN OF CARE: Discharge to home after PACU ? ?PATIENT DISPOSITION:  PACU - hemodynamically stable. ?  ? ?

## 2021-05-29 NOTE — Discharge Instructions (Addendum)
Loosen Ace wrap if hand swells ?Pain medicine as directed ?Keep dressing clean and dry until return visit ?Work your fingers all you can ? ?AMBULATORY SURGERY  ?DISCHARGE INSTRUCTIONS ? ? ?The drugs that you were given will stay in your system until tomorrow so for the next 24 hours you should not: ? ?Drive an automobile ?Make any legal decisions ?Drink any alcoholic beverage ? ? ?You may resume regular meals tomorrow.  Today it is better to start with liquids and gradually work up to solid foods. ? ?You may eat anything you prefer, but it is better to start with liquids, then soup and crackers, and gradually work up to solid foods. ? ? ?Please notify your doctor immediately if you have any unusual bleeding, trouble breathing, redness and pain at the surgery site, drainage, fever, or pain not relieved by medication. ? ? ? ?Additional Instructions: ? ? ? ? ?Please contact your physician with any problems or Same Day Surgery at [phone], Monday through Friday 6 am to 4 pm, or [facility] at [facility] number at [phone].  ?

## 2021-05-29 NOTE — Anesthesia Preprocedure Evaluation (Signed)
Anesthesia Evaluation  ?Patient identified by MRN, date of birth, ID band ?Patient awake ? ? ? ?Reviewed: ?Allergy & Precautions, NPO status , Patient's Chart, lab work & pertinent test results ? ?History of Anesthesia Complications ?Negative for: history of anesthetic complications ? ?Airway ?Mallampati: II ? ?TM Distance: >3 FB ?Neck ROM: Full ? ? ? Dental ? ?(+) Upper Dentures, Lower Dentures, Dental Advidsory Given ?  ?Pulmonary ?neg pulmonary ROS, neg shortness of breath, neg sleep apnea, neg COPD, neg recent URI,  ?  ?breath sounds clear to auscultation- rhonchi ?(-) wheezing ? ? ? ? ? Cardiovascular ?hypertension, Pt. on medications ?(-) angina(-) CAD, (-) Past MI, (-) Cardiac Stents and (-) CABG (-) dysrhythmias + Valvular Problems/Murmurs  ?Rhythm:Regular Rate:Normal ?- Systolic murmurs and - Diastolic murmurs ? ?  ?Neuro/Psych ?neg Seizures negative neurological ROS ? negative psych ROS  ? GI/Hepatic ?Neg liver ROS, GERD  ,  ?Endo/Other  ?diabetes, Oral Hypoglycemic Agents ? Renal/GU ?negative Renal ROS  ? ?  ?Musculoskeletal ? ?(+) Arthritis ,  ? Abdominal ?(+) + obese,   ?Peds ? Hematology ? ?(+) Blood dyscrasia, anemia ,   ?Anesthesia Other Findings ?Past Medical History: ?No date: Anemia ?No date: DDD (degenerative disc disease), cervical ?No date: DDD (degenerative disc disease), thoracic ?No date: Diabetes mellitus without complication ([place]) ?No date: GERD (gastroesophageal reflux disease) ?No date: Heart murmur ?No date: History of kidney stones ?No date: Hyperlipidemia ?No date: Hypertension ?No date: Osteoporosis ?No date: Sarcoid ?No date: Spinal stenosis of cervical region ? ? Reproductive/Obstetrics ? ?  ? ? ? ? ? ? ? ? ? ? ? ? ? ?  ?  ? ? ? ? ? ? ? ? ?Anesthesia Physical ? ?Anesthesia Plan ? ?ASA: 2 ? ?Anesthesia Plan: General  ? ?Post-op Pain Management:   ? ?Induction: Intravenous ? ?PONV Risk Score and Plan: 2 and Ondansetron, Dexamethasone and Treatment  may vary due to age or medical condition ? ?Airway Management Planned: LMA ? ?Additional Equipment:  ? ?Intra-op Plan:  ? ?Post-operative Plan: Extubation in OR ? ?Informed Consent: I have reviewed the patients History and Physical, chart, labs and discussed the procedure including the risks, benefits and alternatives for the proposed anesthesia with the patient or authorized representative who has indicated his/her understanding and acceptance.  ? ? ? ?Dental advisory given ? ?Plan Discussed with: CRNA and Anesthesiologist ? ?Anesthesia Plan Comments:   ? ? ? ? ? ? ?Anesthesia Quick Evaluation ? ?

## 2021-05-29 NOTE — Anesthesia Procedure Notes (Signed)
Procedure Name: LMA Insertion ?Date/Time: 05/29/2021 1:51 PM ?Performed by: Jerrye Noble, CRNA ?Pre-anesthesia Checklist: Patient identified, Emergency Drugs available, Suction available and Patient being monitored ?Patient Re-evaluated:Patient Re-evaluated prior to induction ?Oxygen Delivery Method: Circle system utilized ?Preoxygenation: Pre-oxygenation with 100% oxygen ?Induction Type: IV induction ?Ventilation: Mask ventilation without difficulty ?LMA: LMA inserted ?LMA Size: 4.0 ?Number of attempts: 2 ?Placement Confirmation: positive ETCO2 and breath sounds checked- equal and bilateral ?Tube secured with: Tape ?Dental Injury: Teeth and Oropharynx as per pre-operative assessment  ? ? ? ? ?

## 2021-05-29 NOTE — H&P (Signed)
Chief Complaint  ?Patient presents with  ? Right Hand - Numbness, Follow-up  ? ? ?History of the Present Illness: ?Anna Castro is a 71 y.o. female here today.  ? ?The patient presents for evaluation of right hand pain. The patient had a prior carpal tunnel syndrome many years ago. She had a left carpal tunnel surgery 1 year ago in the hospital and now is thinking about right carpal tunnel release. She had an EMG nerve conduction test by Dr. Manuella Ghazi previously that showed severe bilateral chronic carpal tunnel syndrome. ? ?The patient states her left hand is doing well. Her right hand is still numb and radiates up to her arm. ? ?The patient is not on any blood thinners. She has dentures. ? ?I have reviewed past medical, surgical, social and family history, and allergies as documented in the EMR. ? ?Past Medical History: ?Past Medical History:  ?Diagnosis Date  ? Anemia  ? Diabetes (CMS-HCC)  ? GERD (gastroesophageal reflux disease)  ? Hyperlipidemia  ? Osteoporosis, post-menopausal  ? Sarcoid  ? Thyroid disease  ? ?Past Surgical History: ?Past Surgical History:  ?Procedure Laterality Date  ? ENDOSCOPIC CARPAL TUNNEL RELEASE Left 2001  ? ENDOSCOPIC CARPAL TUNNEL RELEASE Right 2007  ? EGD 10/08/2006  ? COLONOSCOPY 11/16/2012  ?02/19/1997, 09/16/2001, 10/08/2006. 1st degree relative with colorectal CA, repeat 5 years, Dr. Candace Cruise.  ? COLONOSCOPY 01/19/2018  ?Tubular adenoma of the colon/Repeat 85yr/TKT  ? ?Past Family History: ?Family History  ?Problem Relation Age of Onset  ? Breast cancer Mother  ? Coronary Artery Disease (Blocked arteries around heart) Father  ? ?Medications: ?Current Outpatient Medications Ordered in Epic  ?Medication Sig Dispense Refill  ? acetaminophen (TYLENOL) 500 MG tablet Take 1,000 mg by mouth as needed for Pain  ? azithromycin (ZITHROMAX) 250 MG tablet Take 2 tablets ('500mg'$ ) by mouth on Day 1. Take 1 tablet ('250mg'$ ) by mouth on Days 2-5. 6 tablet 0  ? bimatoprost (LUMIGAN) 0.01 % ophthalmic  solution 1 drop nightly.  ? cholecalciferol (VITAMIN D3) 1,250 mcg (50,000 unit) capsule Take 1 capsule (50,000 Units total) by mouth once a week  ? ergocalciferol, vitamin D2, 1,250 mcg (50,000 unit) capsule TAKE 1 CAPSULE BY MOUTH ONE TIME PER WEEK 12 capsule 0  ? glimepiride (AMARYL) 4 MG tablet TAKE 1 TABLET BY MOUTH DAILY WITH BREAKFAST 90 tablet 3  ? hydroCHLOROthiazide (HYDRODIURIL) 12.5 MG tablet Take 1 tablet (12.5 mg total) by mouth once daily 30 tablet 2  ? lansoprazole (PREVACID) 30 MG DR capsule Take by mouth  ? losartan (COZAAR) 100 MG tablet TAKE 1 TABLET BY MOUTH EVERY DAY 90 tablet 3  ? meloxicam (MOBIC) 7.5 MG tablet Take 1 tablet (7.5 mg total) by mouth once daily 30 tablet 2  ? predniSONE (DELTASONE) 10 MG tablet 2 tabs daily for 4 days, 1 tab daily for 4 days 12 tablet 0  ? rosuvastatin (CRESTOR) 10 MG tablet TAKE 1 TABLET BY MOUTH EVERY DAY 90 tablet 3  ? timoloL maleate (TIMOPTIC) 0.5 % ophthalmic solution Place 1 drop into both eyes 2 (two) times daily  ? ?No current Epic-ordered facility-administered medications on file.  ? ?Allergies: ?No Known Allergies  ? ?Body mass index is 35.1 kg/m?. ? ?Review of Systems: ?A comprehensive 14 point ROS was performed, reviewed, and the pertinent orthopaedic findings are documented in the HPI. ? ?Vitals:  ?05/12/21 0814  ?BP: 134/78  ? ? ?General Physical Examination:  ? ?General/Constitutional: No apparent distress: well-nourished and well developed. ?Eyes:  Pupils equal, round with synchronous movement. ?Lungs: Clear to auscultation ?HEENT: Full dentures. ?Vascular: No edema, swelling or tenderness, except as noted in detailed exam. ?Cardiac: Heart rate and rhythm is regular. ?Integumentary: No impressive skin lesions present, except as noted in detailed exam. ?Neuro/Psych: Normal mood and affect, oriented to person, place and time. ? ?On exam, positive Tinel sign on the right. Positive Phalen's test at 5 seconds on the right. Significant thenar atrophy  on the right. Good intrinsic strength. Diminished sensation to light touch in the right thumb, index, and long fingers. ? ?Radiographs: ? ?No new imaging studies were obtained today. ? ?Assessment: ?ICD-10-CM  ?1. Carpal tunnel syndrome, bilateral upper limbs G56.03  ?2. Type II diabetes mellitus with complication (CMS-HCC) T70.1  ?3. Severe obesity (BMI 35.0-39.9) with comorbidity (CMS-HCC) E66.01  ? ?Plan: ? ?The patient has clinical findings of severe right carpal tunnel syndrome based on exam, symptoms, and EMG nerve conduction study. ? ?We discussed the patient's prior EMG nerve conduction study findings. I recommend right carpal tunnel release in the hospital. I explained the surgery and postoperative course in detail. ? ?We will schedule the patient for right carpal tunnel release in the near future. ? ?Surgical Risks: ? ?The nature of the condition and the proposed procedure has been reviewed in detail with the patient. Surgical versus non-surgical options and prognosis for recovery have been reviewed and the inherent risks and benefits of each have been discussed including the risks of infection, bleeding, injury to nerves/blood vessels/tendons, incomplete relief of symptoms, persisting pain and/or stiffness, loss of function, complex regional pain syndrome, failure of the procedure, as appropriate. ? ?Teeth: Full dentures, upper and lower. ? ?Document Attestation: ?I, T. Tamilselvi, have reviewed and updated documentation for [facility], MD, utilizing [employer] DAX.  ? ?Electronically signed by Lauris Poag, MD at 05/12/2021 8:53 PM EST ? ?Reviewed  H+P. ?No changes noted. ? ? ?

## 2021-05-29 NOTE — Transfer of Care (Signed)
Immediate Anesthesia Transfer of Care Note ? ?Patient: Anna Castro ? ?Procedure(s) Performed: Right carpal tunnel release (Right) ? ?Patient Location: PACU ? ?Anesthesia Type:General ? ?Level of Consciousness: drowsy and patient cooperative ? ?Airway & Oxygen Therapy: Patient Spontanous Breathing and Patient connected to face mask oxygen ? ?Post-op Assessment: Report given to RN and Post -op Vital signs reviewed and stable ? ?Post vital signs: Reviewed and stable ? ?Last Vitals:  ?Vitals Value Taken Time  ?BP 122/63   ?Temp    ?Pulse 69 05/29/21 1416  ?Resp 17 05/29/21 1416  ?SpO2 100 % 05/29/21 1416  ?Vitals shown include unvalidated device data. ? ?Last Pain:  ?Vitals:  ? 05/29/21 1212  ?TempSrc: Temporal  ?PainSc: 0-No pain  ?   ? ?  ? ?Complications: No notable events documented. ?

## 2021-05-30 ENCOUNTER — Encounter: Payer: Self-pay | Admitting: Orthopedic Surgery

## 2021-05-31 NOTE — Anesthesia Postprocedure Evaluation (Signed)
Anesthesia Post Note ? ?Patient: Anna Castro ? ?Procedure(s) Performed: Right carpal tunnel release (Right) ? ?Patient location during evaluation: PACU ?Anesthesia Type: General ?Level of consciousness: awake and alert ?Pain management: pain level controlled ?Vital Signs Assessment: post-procedure vital signs reviewed and stable ?Respiratory status: spontaneous breathing, nonlabored ventilation, respiratory function stable and patient connected to nasal cannula oxygen ?Cardiovascular status: blood pressure returned to baseline and stable ?Postop Assessment: no apparent nausea or vomiting ?Anesthetic complications: no ? ? ?No notable events documented. ? ? ?Last Vitals:  ?Vitals:  ? 05/29/21 1441 05/29/21 1447  ?BP: (!) 139/59 (!) 146/51  ?Pulse: 65 62  ?Resp: 15 16  ?Temp: (!) 36.1 ?C (!) 36.2 ?C  ?SpO2: 98% 100%  ?  ?Last Pain:  ?Vitals:  ? 05/30/21 1554  ?TempSrc:   ?PainSc: 3   ? ? ?  ?  ?  ?  ?  ?  ? ?Martha Clan ? ? ? ? ?

## 2021-09-04 ENCOUNTER — Ambulatory Visit
Admission: RE | Admit: 2021-09-04 | Discharge: 2021-09-04 | Disposition: A | Discharge status: Home / Self Care | Payer: Medicare HMO | Source: Ambulatory Visit | Attending: Internal Medicine | Admitting: Internal Medicine

## 2021-09-04 DIAGNOSIS — Z1231 Encounter for screening mammogram for malignant neoplasm of breast: Secondary | ICD-10-CM | POA: Diagnosis not present

## 2022-07-24 ENCOUNTER — Other Ambulatory Visit: Payer: Self-pay | Admitting: Internal Medicine

## 2022-07-24 DIAGNOSIS — Z1231 Encounter for screening mammogram for malignant neoplasm of breast: Secondary | ICD-10-CM

## 2022-09-11 ENCOUNTER — Ambulatory Visit
Admission: RE | Admit: 2022-09-11 | Discharge: 2022-09-11 | Disposition: A | Discharge status: Home / Self Care | Payer: Medicare HMO | Source: Ambulatory Visit | Attending: Internal Medicine | Admitting: Internal Medicine

## 2022-09-11 DIAGNOSIS — Z1231 Encounter for screening mammogram for malignant neoplasm of breast: Secondary | ICD-10-CM

## 2022-11-27 ENCOUNTER — Ambulatory Visit: Payer: Medicare HMO

## 2022-11-27 DIAGNOSIS — K640 First degree hemorrhoids: Secondary | ICD-10-CM | POA: Diagnosis not present

## 2022-11-27 DIAGNOSIS — Z09 Encounter for follow-up examination after completed treatment for conditions other than malignant neoplasm: Secondary | ICD-10-CM | POA: Diagnosis present

## 2022-11-27 DIAGNOSIS — Z86010 Personal history of colonic polyps: Secondary | ICD-10-CM | POA: Diagnosis not present

## 2023-03-04 ENCOUNTER — Other Ambulatory Visit: Payer: Self-pay | Admitting: Surgery

## 2023-03-04 DIAGNOSIS — M4802 Spinal stenosis, cervical region: Secondary | ICD-10-CM

## 2023-03-08 ENCOUNTER — Encounter: Payer: Self-pay | Admitting: Surgery

## 2023-03-12 ENCOUNTER — Ambulatory Visit
Admission: RE | Admit: 2023-03-12 | Discharge: 2023-03-12 | Disposition: A | Discharge status: Home / Self Care | Payer: Medicare HMO | Source: Ambulatory Visit | Attending: Surgery | Admitting: Surgery

## 2023-03-12 DIAGNOSIS — M4802 Spinal stenosis, cervical region: Secondary | ICD-10-CM

## 2023-06-09 ENCOUNTER — Inpatient Hospital Stay
Admission: RE | Admit: 2023-06-09 | Discharge: 2023-06-09 | Disposition: A | Discharge status: Home / Self Care | Payer: Self-pay | Source: Ambulatory Visit | Attending: Neurosurgery | Admitting: Neurosurgery

## 2023-06-09 ENCOUNTER — Other Ambulatory Visit: Payer: Self-pay | Admitting: Family Medicine

## 2023-06-09 DIAGNOSIS — Z049 Encounter for examination and observation for unspecified reason: Secondary | ICD-10-CM

## 2023-06-30 NOTE — Progress Notes (Deleted)
 Referring Physician:  Brant Caldron, FNP [address] [place],  [place] [address]  Primary Physician:  Yehuda Helms, MD  History of Present Illness: 06/30/2023 [name] is here today with a chief complaint of ***  left lateral neck pain radiating down left arm causing pain, numbness and will intermittently go down the right arm.     Duration: *** Location: *** Quality: *** Severity: ***  Precipitating: aggravated by *** Modifying factors: made better by *** Weakness: none Timing: *** Bowel/Bladder Dysfunction: none  Conservative measures:  Physical therapy: has participated in PT at [facility]  Multimodal medical therapy including regular antiinflammatories: Flexeril, Gabapentin, Meloxicam, Hydrocodone , Prednisone, Tizanidine, Tramadol Injections: 05/07/2023: Left C5-6 transforaminal ESI (dexamethasone  12 mg) 04/02/2023: Left C5-6 transforaminal ESI (75% relief, dexamethasone  10 mg) 03/30/2023: Left C5-6 transforaminal ESI (aborted) 01/04/2015: Left C5-6 transforaminal ESI (good relief)   Past Surgery: ***none  Anna Castro has ***no symptoms of cervical myelopathy.  The symptoms are causing a significant impact on the patient's life.   I have utilized the care everywhere function in epic to review the outside records available from external health systems.  Review of Systems:  A 10 point review of systems is negative, except for the pertinent positives and negatives detailed in the HPI.  Past Medical History: Past Medical History:  Diagnosis Date   Anemia    DDD (degenerative disc disease), cervical    DDD (degenerative disc disease), thoracic    Diabetes mellitus without complication (HCC)    GERD (gastroesophageal reflux disease)    Heart murmur    History of kidney stones    Hyperlipidemia    Hypertension    Osteoporosis    Sarcoid    Spinal stenosis of cervical region     Past Surgical History: Past Surgical History:   Procedure Laterality Date   CARPAL TUNNEL RELEASE Left 2001   CARPAL TUNNEL RELEASE Right 2007   CARPAL TUNNEL RELEASE Left 05/28/2020   Procedure: CARPAL TUNNEL RELEASE;  Surgeon: Molli Angelucci, MD;  Location: [facility] ORS;  Service: Orthopedics;  Laterality: Left;   CARPAL TUNNEL RELEASE Right 05/29/2021   Procedure: Right carpal tunnel release;  Surgeon: Molli Angelucci, MD;  Location: [facility] ORS;  Service: Orthopedics;  Laterality: Right;   COLONOSCOPY  02/19/1997, 09/16/2001, 10/08/2006,   COLONOSCOPY WITH PROPOFOL  N/A 01/19/2018   Procedure: COLONOSCOPY WITH PROPOFOL ;  Surgeon: Toledo, Alphonsus Jeans, MD;  Location: [facility] ENDOSCOPY;  Service: Gastroenterology;  Laterality: N/A;   ESOPHAGOGASTRODUODENOSCOPY  10/08/2006   TUBAL LIGATION      Allergies: Allergies as of 07/01/2023   (No Known Allergies)    Medications:  Current Outpatient Medications:    acetaminophen  (TYLENOL ) 500 MG tablet, Take 500-1,000 mg by mouth every 8 (eight) hours as needed for moderate pain., Disp: , Rfl:    glimepiride (AMARYL) 4 MG tablet, Take 4 mg by mouth every morning., Disp: , Rfl:    hydrochlorothiazide (HYDRODIURIL) 12.5 MG tablet, Take 12.5 mg by mouth daily., Disp: , Rfl:    lansoprazole (PREVACID) 30 MG capsule, Take 30 mg by mouth daily as needed (acid reflux)., Disp: , Rfl:    losartan (COZAAR) 100 MG tablet, Take 100 mg by mouth daily., Disp: , Rfl:    rosuvastatin (CRESTOR) 10 MG tablet, Take 10 mg by mouth daily., Disp: , Rfl:    timolol (TIMOPTIC) 0.5 % ophthalmic solution, Place 1 drop into both eyes in the morning and at bedtime., Disp: , Rfl:    Vitamin  D, Ergocalciferol, (DRISDOL) 50000 units CAPS capsule, Take 50,000 Units by mouth every 7 (seven) days. Sunday, Disp: , Rfl:   Social History: Social History   Tobacco Use   Smoking status: Never   Smokeless tobacco: Never  Vaping Use   Vaping status: Never Used  Substance Use Topics   Alcohol use: Never   Drug use: Never    Family Medical  History: Family History  Problem Relation Age of Onset   Breast cancer Mother 68   Kidney disease Mother     Physical Examination: There were no vitals filed for this visit.  General: Patient is in no apparent distress. Attention to examination is appropriate.  Neck:   Supple.  Full range of motion.  Respiratory: Patient is breathing without any difficulty.   NEUROLOGICAL:     Awake, alert, oriented to person, place, and time.  Speech is clear and fluent.   Cranial Nerves: Pupils equal round and reactive to light.  Facial tone is symmetric.  Facial sensation is symmetric. Shoulder shrug is symmetric. Tongue protrusion is midline.  There is no pronator drift.  Strength: Side Biceps Triceps Deltoid Interossei Grip Wrist Ext. Wrist Flex.  R 5 5 5 5 5 5 5   L 5 5 5 5 5 5 5    Side Iliopsoas Quads Hamstring PF DF EHL  R 5 5 5 5 5 5   L 5 5 5 5 5 5    Reflexes are ***2+ and symmetric at the biceps, triceps, brachioradialis, patella and achilles.   Hoffman's is absent.   Bilateral upper and lower extremity sensation is intact to light touch.    No evidence of dysmetria noted.  Gait is normal.     Medical Decision Making  Imaging: ***  I have personally reviewed the images and agree with the above interpretation.  Assessment and Plan: [name] is a pleasant 73 y.o. female with ***      Thank you for involving me in the care of this patient.      Chester K. Mont Antis MD, [facility] Neurosurgery

## 2023-07-01 ENCOUNTER — Ambulatory Visit: Admitting: Neurosurgery

## 2023-07-22 ENCOUNTER — Ambulatory Visit: Admitting: Neurosurgery

## 2023-07-28 NOTE — Progress Notes (Signed)
 Referring Physician:  Brant Caldron, FNP [address] [place],  [place] [address]  Primary Physician:  Anna Helms, MD  History of Present Illness: 07/29/2023 [name] is here today with a chief complaint of severe neck arm pain that radiated down her left arm.  She said this began in December 2024, but has improved substantially since she began physical therapy.  She had 2 injections which helped and then physical therapy which has diminished her pain to approximately 10% of what it was previously.  It is now tolerable.  She denies any weakness.  She does have some element of moderate left-sided carpal tunnel syndrome but is currently not bothering her substantially.  This was confirmed with a nerve conduction study.  Bowel/Bladder Dysfunction: none  Conservative measures:  Physical therapy: has participated in PT at [facility] and has helped about 85-90%.  Multimodal medical therapy including regular antiinflammatories: Flexeril, Gabapentin, Meloxicam, Hydrocodone , Prednisone, Tizanidine, Tramadol Injections: 05/07/2023: Left C5-6 transforaminal ESI (dexamethasone  12 mg) 04/02/2023: Left C5-6 transforaminal ESI (75% relief, dexamethasone  10 mg) 03/30/2023: Left C5-6 transforaminal ESI (aborted) 01/04/2015: Left C5-6 transforaminal ESI (good relief)   Past Surgery: no spinal surgeries  Anna Castro has no symptoms of cervical myelopathy.  The symptoms are causing a significant impact on the patient's life.   I have utilized the care everywhere function in epic to review the outside records available from external health systems.  Review of Systems:  A 10 point review of systems is negative, except for the pertinent positives and negatives detailed in the HPI.  Past Medical History: Past Medical History:  Diagnosis Date   Anemia    DDD (degenerative disc disease), cervical    DDD (degenerative disc disease), thoracic    Diabetes mellitus without  complication (HCC)    GERD (gastroesophageal reflux disease)    Heart murmur    History of kidney stones    Hyperlipidemia    Hypertension    Osteoporosis    Sarcoid    Spinal stenosis of cervical region     Past Surgical History: Past Surgical History:  Procedure Laterality Date   CARPAL TUNNEL RELEASE Left 2001   CARPAL TUNNEL RELEASE Right 2007   CARPAL TUNNEL RELEASE Left 05/28/2020   Procedure: CARPAL TUNNEL RELEASE;  Surgeon: Molli Angelucci, MD;  Location: [facility];  Service: Orthopedics;  Laterality: Left;   CARPAL TUNNEL RELEASE Right 05/29/2021   Procedure: Right carpal tunnel release;  Surgeon: Molli Angelucci, MD;  Location: [facility];  Service: Orthopedics;  Laterality: Right;   COLONOSCOPY  02/19/1997, 09/16/2001, 10/08/2006,   COLONOSCOPY WITH PROPOFOL  N/A 01/19/2018   Procedure: COLONOSCOPY WITH PROPOFOL ;  Surgeon: Toledo, Alphonsus Jeans, MD;  Location: [facility];  Service: Gastroenterology;  Laterality: N/A;   ESOPHAGOGASTRODUODENOSCOPY  10/08/2006   TUBAL LIGATION      Allergies: Allergies as of 07/29/2023   (No Known Allergies)    Medications:  Current Outpatient Medications:    acetaminophen  (TYLENOL ) 500 MG tablet, Take 500-1,000 mg by mouth every 8 (eight) hours as needed for moderate pain., Disp: , Rfl:    glimepiride (AMARYL) 4 MG tablet, Take 4 mg by mouth every morning., Disp: , Rfl:    hydrochlorothiazide (HYDRODIURIL) 12.5 MG tablet, Take 12.5 mg by mouth daily., Disp: , Rfl:    lansoprazole (PREVACID) 30 MG capsule, Take 30 mg by mouth daily as needed (acid reflux)., Disp: , Rfl:    losartan (COZAAR) 100 MG tablet, Take 100 mg by mouth  daily., Disp: , Rfl:    rosuvastatin (CRESTOR) 10 MG tablet, Take 10 mg by mouth daily., Disp: , Rfl:    timolol (TIMOPTIC) 0.5 % ophthalmic solution, Place 1 drop into both eyes in the morning and at bedtime., Disp: , Rfl:    Vitamin D, Ergocalciferol, (DRISDOL) 50000 units CAPS capsule, Take 50,000 Units by mouth every 7  (seven) days. Sunday, Disp: , Rfl:   Social History: Social History   Tobacco Use   Smoking status: Never   Smokeless tobacco: Never  Vaping Use   Vaping status: Never Used  Substance Use Topics   Alcohol use: Never   Drug use: Never    Family Medical History: Family History  Problem Relation Age of Onset   Breast cancer Mother 50   Kidney disease Mother     Physical Examination: Vitals:   07/29/23 1504  BP: 118/60    General: Patient is in no apparent distress. Attention to examination is appropriate.  Neck:   Supple.  Full range of motion.  Respiratory: Patient is breathing without any difficulty.   NEUROLOGICAL:     Awake, alert, oriented to person, place, and time.  Speech is clear and fluent.   Cranial Nerves: Pupils equal round and reactive to light.  Facial tone is symmetric.  Facial sensation is symmetric. Shoulder shrug is symmetric. Tongue protrusion is midline.  There is no pronator drift.  Strength: Side Biceps Triceps Deltoid Interossei Grip Wrist Ext. Wrist Flex.  R 5 5 5 5 5 5 5   L 5 5 5 5 5 5 5    Side Iliopsoas Quads Hamstring PF DF EHL  R 5 5 5 5 5 5   L 5 5 5 5 5 5    Reflexes are 1+ and symmetric at the biceps, triceps, brachioradialis, patella and achilles.   Hoffman's is absent.   Bilateral upper and lower extremity sensation is intact to light touch.    No evidence of dysmetria noted.  Gait is normal.     Medical Decision Making  Imaging: MRI C spine 03/12/2023 MPRESSION: 1. At C5-C6, progressive severe right and moderate to severe left foraminal stenosis with mild to moderate canal stenosis. 2. At C6-C7, progressive moderate right greater than left foraminal stenosis with mild canal stenosis. 3. At C4-C5, progressive mild right greater than left foraminal stenosis and mild canal stenosis.     Electronically Signed   By: Stevenson Elbe M.D.   On: 03/18/2023 15:34  I have personally reviewed the images and agree with the  above interpretation.  Assessment and Plan: [name] is a pleasant 73 y.o. female with resolving left-sided cervical radiculopathy.  She has cervical stenosis.  At this point, she is much improved.  I would not recommend surgical intervention.  To reevaluate her radiculopathy, I will see her back in 3 months.  If she is still improved at that time, I will see her as needed.  She has carpal tunnel syndrome on the left side.  It is currently not bothering her too much.  She would like to continue to watch.  If it does worsen, I will refer her to my partner Dr. Felipe Horton.      Thank you for involving me in the care of this patient.      Naydeen Speirs K. Mont Antis MD, [facility]

## 2023-07-29 ENCOUNTER — Encounter: Payer: Self-pay | Admitting: Neurosurgery

## 2023-07-29 ENCOUNTER — Ambulatory Visit: Admitting: Neurosurgery

## 2023-07-29 VITALS — BP 118/60 | Ht 62.99 in | Wt 196.0 lb

## 2023-07-29 DIAGNOSIS — G5602 Carpal tunnel syndrome, left upper limb: Secondary | ICD-10-CM | POA: Diagnosis not present

## 2023-07-29 DIAGNOSIS — M4802 Spinal stenosis, cervical region: Secondary | ICD-10-CM | POA: Diagnosis not present

## 2023-07-29 DIAGNOSIS — M5412 Radiculopathy, cervical region: Secondary | ICD-10-CM

## 2023-08-04 ENCOUNTER — Other Ambulatory Visit: Payer: Self-pay | Admitting: Internal Medicine

## 2023-08-04 DIAGNOSIS — Z1231 Encounter for screening mammogram for malignant neoplasm of breast: Secondary | ICD-10-CM

## 2023-09-06 ENCOUNTER — Encounter: Admitting: Occupational Therapy

## 2023-09-07 ENCOUNTER — Ambulatory Visit: Attending: Orthopedic Surgery | Admitting: Occupational Therapy

## 2023-09-07 DIAGNOSIS — Z9289 Personal history of other medical treatment: Secondary | ICD-10-CM | POA: Insufficient documentation

## 2023-09-07 DIAGNOSIS — M25631 Stiffness of right wrist, not elsewhere classified: Secondary | ICD-10-CM | POA: Diagnosis present

## 2023-09-07 DIAGNOSIS — M25632 Stiffness of left wrist, not elsewhere classified: Secondary | ICD-10-CM | POA: Diagnosis present

## 2023-09-07 NOTE — Therapy (Signed)
 OUTPATIENT OCCUPATIONAL THERAPY ORTHO EVALUATION  Patient Name: Anna Castro MRN: [id number] DOB:22-Oct-1950, 73 y.o., female Today's Date: 09/07/2023  PCP: Dr Auston REFERRING PROVIDER: Dr Kathlynn  END OF SESSION:  OT End of Session - 09/07/23 1615     Visit Number 1    Number of Visits 8    Date for OT Re-Evaluation 10/19/23    OT Start Time 1615    OT Stop Time 1655    OT Time Calculation (min) 40 min    Activity Tolerance Patient tolerated treatment well    Behavior During Therapy WFL for tasks assessed/performed          Past Medical History:  Diagnosis Date   Anemia    DDD (degenerative disc disease), cervical    DDD (degenerative disc disease), thoracic    Diabetes mellitus without complication (HCC)    GERD (gastroesophageal reflux disease)    Heart murmur    History of kidney stones    Hyperlipidemia    Hypertension    Osteoporosis    Sarcoid    Spinal stenosis of cervical region    Past Surgical History:  Procedure Laterality Date   CARPAL TUNNEL RELEASE Left 2001   CARPAL TUNNEL RELEASE Right 2007   CARPAL TUNNEL RELEASE Left 05/28/2020   Procedure: CARPAL TUNNEL RELEASE;  Surgeon: Kathlynn Sharper, MD;  Location: [facility];  Service: Orthopedics;  Laterality: Left;   CARPAL TUNNEL RELEASE Right 05/29/2021   Procedure: Right carpal tunnel release;  Surgeon: Kathlynn Sharper, MD;  Location: [facility];  Service: Orthopedics;  Laterality: Right;   COLONOSCOPY  02/19/1997, 09/16/2001, 10/08/2006,   COLONOSCOPY WITH PROPOFOL  N/A 01/19/2018   Procedure: COLONOSCOPY WITH PROPOFOL ;  Surgeon: Toledo, Ladell POUR, MD;  Location: [facility];  Service: Gastroenterology;  Laterality: N/A;   ESOPHAGOGASTRODUODENOSCOPY  10/08/2006   TUBAL LIGATION     Patient Active Problem List   Diagnosis Date Noted   Cervical radiculitis 01/04/2015   DDD (degenerative disc disease), cervical 01/04/2015   Spinal stenosis of cervical region 01/04/2015   Hyperglycemia, unspecified  09/18/2014   Anemia 08/28/2013   GERD (gastroesophageal reflux disease) 08/28/2013   Hyperlipemia 08/28/2013   Osteoporosis 08/28/2013   Sarcoid 08/28/2013    ONSET DATE: 5/25  REFERRING DIAG: Bilateral recurrent CTS  THERAPY DIAG:  History of sensory changes  Stiffness of right wrist, not elsewhere classified  Stiffness of left wrist, not elsewhere classified  Rationale for Evaluation and Treatment: Rehabilitation  SUBJECTIVE:   SUBJECTIVE STATEMENT: I had a nerve conduction test that showed I got carpal tunnel in both my hands.  In end of May.  But I had therapy for my neck and really helped for my arms and the pain in my left arm.  After little numbness pins-and-needles in all of the fingertips.  But I can feel.  I just feel like I do not have a good grip I drop things.  As well as fine motor things like my jewelry and doing buttons Pt accompanied by: self  PERTINENT HISTORY:  Dr Kathlynn 08/16/23 Assessment/Plan:   Assessment & Plan Carpal tunnel syndrome  Bilateral carpal tunnel syndrome persists despite previous surgical releases. She experiences left hand pain and slight atrophy in both hands. A recent nerve conduction study shows moderate carpal tunnel syndrome. Symptoms include occasional nocturnal awakening and hand clumsiness. Tinel's sign is negative on the left and slightly positive on the right, while Phalen's test is positive on the left and negative on the right. Refer her to  hand therapy at the hospital's outpatient facility on [facility]. She should contact the facility to specify the need for hand therapy. If symptoms do not improve after a month, consider a carpal tunnel injection with Dr. Maryann. Discuss the potential need for a driver if both hands are injected due to temporary hand clumsiness. Explain that the injection involves ultrasound guidance and may result in temporary hand clumsiness.  Cervical radiculopathy  Cervical radiculopathy is managed by Dr.  Katrina. Her symptoms have improved with ongoing therapy.  Type 2 diabetes mellitus  Type 2 diabetes mellitus is managed with Amaryl. Her current A1c is 7.1%. Diagnoses and all orders for this visit:  Carpal tunnel syndrome, bilateral upper limbs - Ambulatory Referral to Occupational Therapy   PRECAUTIONS: None   WEIGHT BEARING RESTRICTIONS: No  PAIN:  Are you having pain?  No pain.  Pins-and-needles numbness in all fingertips  FALLS: Has patient fallen in last 6 months? 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  Does her own housework.  Does some work for her children on the computer.  Watch TV  PATIENT GOALS: Just want to keep up my strength in my hands  NEXT MD VISIT: After 4 weeks of therapy  OBJECTIVE:  Note: Objective measures were completed at Evaluation unless otherwise noted.  HAND DOMINANCE: Right  ADLs: Patient feels like dropping objects.  Has a hard time with jewelry or doing buttons or picking up small objects   FUNCTIONAL OUTCOME MEASURES: Next session UPPER EXTREMITY ROM:     Active ROM Right eval Left eval  Shoulder flexion    Shoulder abduction    Shoulder adduction    Shoulder extension    Shoulder internal rotation    Shoulder external rotation    Elbow flexion    Elbow extension    Wrist flexion 90 90  Wrist extension 55 62  Wrist ulnar deviation 30 30  Wrist radial deviation 20 20  Wrist pronation 90 90  Wrist supination 90 90  (Blank rows = not tested) Active range of motion in digit flexion within normal limits as well as thumb palmar radial abduction within normal limits.  Opposition within normal limits. Opposition to fifth strength 5/5 Strength in thumb palmar radial abduction 5/5.  HAND FUNCTION: Grip strength: Right: 44 lbs; Left: 39 lbs, Lateral pinch: Right: 14 lbs,  Left: 14 lbs, and 3 point pinch: Right: 9 lbs, Left: 11 lbs-grip and lateral pinch within normal limits for age.  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  Patient is a diabetic.?  Neuropathy  EDEMA: Stiffness in the morning with a tight feeling in the fingers  COGNITION: Overall cognitive status: Within functional limits for tasks assessed      TREATMENT DATE: 09/07/23  M recommend for patient to do contrast in the mornings as well as after dinner Followed by soft tissue massage over roller for volar forearm and hand 20 reps Followed by prayer stretch for wrist extension pain-free 10 reps hold 5 seconds Followed by gentle tendon glides 10 reps each As well as opposition focusing on the oval 5 reps        PATIENT EDUCATION: Education details: findings of eval and HEP  Person educated: Patient Education method: Explanation, Demonstration, Tactile cues, Verbal cues, and Handouts Education comprehension: verbalized understanding, returned demonstration, verbal cues required, and needs further education    GOALS: Goals reviewed with patient? Yes   LONG TERM GOALS: Target date: 4 wks  Patient to be independent home program to increase wrist extension to within functional limits for patient to push heavy door and push up from a chair symptom-free Baseline: Wrist extension 55 on the right and left 62 pounds.  Tightness in the forearm. Goal status: INITIAL  2.  Patient to verbalize and demo 3 modifications and joint protection to be independent in fine motor activities like jewelry and buttons and picking up small objects Baseline: Because of neuropathy in all 5 tips of 5 digits  decreased fine motor Goal status: INITIAL    ASSESSMENT:  CLINICAL IMPRESSION: Patient seen today for occupational therapy evaluation for bilateral carpal tunnel recurrence he.  Patient had bilateral carpal tunnel release in the past.  Recent nerve conduction test in May showed moderate to [facility]enn carpal tunnel.  Patient present today with negative Tinel over carpal tunnel and Phalen's.  Patient denies any pain.  Reports some pins-and-needles numbness in the tips of all 5 digits.  Patient is a diabetic.?  Neuropathy.  Patient's active range of motion in wrist and forearm as well as strength is within normal limits except wrist extension.  Thumb palmar radial abduction 5/5 strength as well as opposition to fifth.  Grip and lateral pinch within normal limits for age.  3-point pinch in the lower range for her age but could be limited because of neuropathy in the fingertips of all 5 digits.  Patient was provided for home program to decrease and address soft tissue over the volar forearm and hand prior to wrist extension stretches.  Followed by tendon glides and modifications.  Patient to follow-up with me in 2 weeks we will reassess symptoms as well as range of motion strength and independence in ADLs and IADLs.SABRA   PERFORMANCE DEFICITS: in functional skills including ADLs, IADLs, ROM, strength, pain, flexibility, decreased knowledge of use of DME, and UE functional use,   and psychosocial skills including environmental adaptation and routines and behaviors.   IMPAIRMENTS: are limiting patient from ADLs, IADLs, rest and sleep, play, leisure, and social participation.   COMORBIDITIES: has no other co-morbidities that affects occupational performance. Patient will benefit from skilled OT to address above impairments and improve overall function.  MODIFICATION OR ASSISTANCE TO COMPLETE EVALUATION: No modification of tasks or assist necessary to complete an evaluation.  OT OCCUPATIONAL PROFILE AND HISTORY:  Problem focused assessment: Including review of records relating to presenting problem.  CLINICAL DECISION MAKING: LOW - limited treatment options, no task modification necessary  REHAB POTENTIAL: Good for goals  EVALUATION COMPLEXITY: Low    PLAN:  OT FREQUENCY: 1-2x/week  OT DURATION: 6 weeks  PLANNED INTERVENTIONS: 97168 OT Re-evaluation, 97535 self care/ADL training, 02889 therapeutic exercise, 97530 therapeutic activity, 97112 neuromuscular re-education, 97140 manual therapy, 97035 ultrasound, 97034 contrast bath, 97033 iontophoresis, 97760 Orthotic  Initial, 02236 Orthotic/Prosthetic subsequent, passive range of motion, patient/family education, and DME and/or AE instructions    CONSULTED AND AGREED WITH PLAN OF CARE: Patient     Ancel Peters, OTR/L,CLT 09/07/2023, 5:01 PM

## 2023-09-09 ENCOUNTER — Ambulatory Visit: Admitting: Occupational Therapy

## 2023-09-13 ENCOUNTER — Ambulatory Visit: Admitting: Occupational Therapy

## 2023-09-16 ENCOUNTER — Ambulatory Visit
Admission: RE | Admit: 2023-09-16 | Discharge: 2023-09-16 | Disposition: A | Discharge status: Home / Self Care | Source: Ambulatory Visit | Attending: Internal Medicine | Admitting: Internal Medicine

## 2023-09-16 DIAGNOSIS — Z1231 Encounter for screening mammogram for malignant neoplasm of breast: Secondary | ICD-10-CM | POA: Insufficient documentation

## 2023-09-28 ENCOUNTER — Ambulatory Visit: Admitting: Occupational Therapy

## 2023-09-28 DIAGNOSIS — Z9289 Personal history of other medical treatment: Secondary | ICD-10-CM | POA: Diagnosis not present

## 2023-09-28 DIAGNOSIS — M25632 Stiffness of left wrist, not elsewhere classified: Secondary | ICD-10-CM

## 2023-09-28 DIAGNOSIS — M25631 Stiffness of right wrist, not elsewhere classified: Secondary | ICD-10-CM

## 2023-09-28 NOTE — Therapy (Signed)
 OUTPATIENT OCCUPATIONAL THERAPY ORTHO TREATMENT/Dicharge  Patient Name: Anna Castro MRN: [id number] DOB:[birth year], 73 y.o., female Today's Date: 09/28/2023  PCP: Dr Auston REFERRING PROVIDER: Dr Kathlynn  END OF SESSION:  OT End of Session - 09/28/23 1615     Visit Number 2    Number of Visits 8    Date for OT Re-Evaluation 10/19/23    OT Start Time 1615    OT Stop Time 1639    OT Time Calculation (min) 24 min    Activity Tolerance Patient tolerated treatment well    Behavior During Therapy WFL for tasks assessed/performed          Past Medical History:  Diagnosis Date   Anemia    DDD (degenerative disc disease), cervical    DDD (degenerative disc disease), thoracic    Diabetes mellitus without complication (HCC)    GERD (gastroesophageal reflux disease)    Heart murmur    History of kidney stones    Hyperlipidemia    Hypertension    Osteoporosis    Sarcoid    Spinal stenosis of cervical region    Past Surgical History:  Procedure Laterality Date   CARPAL TUNNEL RELEASE Left 2001   CARPAL TUNNEL RELEASE Right 2007   CARPAL TUNNEL RELEASE Left 05/28/2020   Procedure: CARPAL TUNNEL RELEASE;  Surgeon: Kathlynn Sharper, MD;  Location: [facility];  Service: Orthopedics;  Laterality: Left;   CARPAL TUNNEL RELEASE Right 05/29/2021   Procedure: Right carpal tunnel release;  Surgeon: Kathlynn Sharper, MD;  Location: [facility];  Service: Orthopedics;  Laterality: Right;   COLONOSCOPY  02/19/1997, 09/16/2001, 10/08/2006,   COLONOSCOPY WITH PROPOFOL  N/A 01/19/2018   Procedure: COLONOSCOPY WITH PROPOFOL ;  Surgeon: Toledo, Ladell POUR, MD;  Location: [facility];  Service: Gastroenterology;  Laterality: N/A;   ESOPHAGOGASTRODUODENOSCOPY  10/08/2006   TUBAL LIGATION     Patient Active Problem List   Diagnosis Date Noted   Cervical radiculitis 01/04/2015   DDD (degenerative disc disease), cervical 01/04/2015   Spinal stenosis of cervical region 01/04/2015   Hyperglycemia,  unspecified 09/18/2014   Anemia 08/28/2013   GERD (gastroesophageal reflux disease) 08/28/2013   Hyperlipemia 08/28/2013   Osteoporosis 08/28/2013   Sarcoid 08/28/2013    ONSET DATE: 5/25  REFERRING DIAG: Bilateral recurrent CTS  THERAPY DIAG:  History of sensory changes  Stiffness of right wrist, not elsewhere classified  Stiffness of left wrist, not elsewhere classified  Rationale for Evaluation and Treatment: Rehabilitation  SUBJECTIVE:   SUBJECTIVE STATEMENT: I am doing really good.  I have been doing my neck exercises.  In my hand exercises.  Maybe not every day but I am doing it.  I do not have any numbness in my fingertips.  I am not dropping things.  I feel like I have a good grip. Pt accompanied by: self  PERTINENT HISTORY:  Dr Kathlynn 08/16/23 Assessment/Plan:   Assessment & Plan Carpal tunnel syndrome  Bilateral carpal tunnel syndrome persists despite previous surgical releases. She experiences left hand pain and slight atrophy in both hands. A recent nerve conduction study shows moderate carpal tunnel syndrome. Symptoms include occasional nocturnal awakening and hand clumsiness. Tinel's sign is negative on the left and slightly positive on the right, while Phalen's test is positive on the left and negative on the right. Refer her to hand therapy at the hospital's outpatient facility on [facility]. She should contact the facility to specify the need for hand therapy. If symptoms do not improve after a month, consider a  carpal tunnel injection with Dr. Maryann. Discuss the potential need for a driver if both hands are injected due to temporary hand clumsiness. Explain that the injection involves ultrasound guidance and may result in temporary hand clumsiness.  Cervical radiculopathy  Cervical radiculopathy is managed by Dr. Katrina. Her symptoms have improved with ongoing therapy.  Type 2 diabetes mellitus  Type 2 diabetes mellitus is managed with Amaryl. Her current  A1c is 7.1%. Diagnoses and all orders for this visit:  Carpal tunnel syndrome, bilateral upper limbs - Ambulatory Referral to Occupational Therapy   PRECAUTIONS: None   WEIGHT BEARING RESTRICTIONS: No  PAIN:  Are you having pain?  Denies any pain or numbness  FALLS: Has patient fallen in last 6 months? 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  Does her own housework.  Does some work for her children on the computer.  Watch TV  PATIENT GOALS: Just want to keep up my strength in my hands  NEXT MD VISIT: After 4 weeks of therapy  OBJECTIVE:  Note: Objective measures were completed at Evaluation unless otherwise noted.  HAND DOMINANCE: Right  ADLs: Patient feels like dropping objects.  Has a hard time with jewelry or doing buttons or picking up small objects   FUNCTIONAL OUTCOME MEASURES: Next session UPPER EXTREMITY ROM:     Active ROM Right eval Left eval R/L 09/28/23  Shoulder flexion     Shoulder abduction     Shoulder adduction     Shoulder extension     Shoulder internal rotation     Shoulder external rotation     Elbow flexion     Elbow extension     Wrist flexion 90 90 90  Wrist extension 55 62 60/70  Wrist ulnar deviation 30 30 30   Wrist radial deviation 20 20 20   Wrist pronation 90 90 90  Wrist supination 90 90 90  (Blank rows = not tested) Active range of motion in digit flexion within normal limits as well as thumb palmar radial abduction within normal limits.  Opposition within normal limits. Opposition to fifth strength 5/5 Strength in thumb palmar radial abduction 5/5.  HAND FUNCTION: Grip strength: Right: 44 lbs; Left: 39 lbs, Lateral pinch: Right: 14 lbs, Left: 14 lbs, and 3 point pinch: Right: 9 lbs, Left: 11 lbs-grip and lateral pinch within normal limits for age.  3-point pinch decreased  patient has some neuropathy tips of all 5 digits 09/28/23 Grip strength: Right: 52 lbs; Left: 52 lbs, Lateral pinch: Right: 17 lbs, Left: 16 lbs, and 3 point pinch: Right: 12 lbs, Left: 14 lbs-great improvement in all to top percentile for age COORDNATION: At evaluation decreased because of neuropathy in all tips of 5 fingers  SENSATION: Patient can distinguish textures, hot and cold, light touch.  Patient is a diabetic.?  Neuropathy  NOW denies any numbness or neuropathy in fingertips since evaluation  EDEMA: Stiffness in the morning with a tight feeling in the fingers  COGNITION: Overall cognitive status: Within functional limits for tasks assessed      TREATMENT DATE: 09/28/23     Patient reports still continues stiffness in the morning and digits. Patient denies any numbness or neuropathy in fingertips. Denies dropping any objects. Assess wrist in all planes.  Strength in all planes and ranges for wrist and forearm 5/5. Wrist extension improved greatly. Thumb  palmar radial abduction as well as opposition 5/5 strength  grip and prehension assessed.  See flowsheet.  Great improvement.  Recommend for patient to continue with same home exercises that she has been doing the last 3 weeks                                                                                                               contrast in the mornings as well as after dinner if needed Followed by soft tissue massage over roller for volar forearm and hand 20 reps Followed by prayer stretch for wrist extension pain-free 10 reps hold 5 seconds Followed by gentle tendon glides 10 reps each As well as opposition focusing on the oval 5 reps        PATIENT EDUCATION: Education details: findings of eval and HEP  Person educated: Patient Education method: Explanation, Demonstration, Tactile cues, Verbal cues, and Handouts Education comprehension: verbalized understanding, returned demonstration, verbal cues required,  and needs further education    GOALS: Goals reviewed with patient? Yes   LONG TERM GOALS: Target date: 4 wks  Patient to be independent home program to increase wrist extension to within functional limits for patient to push heavy door and push up from a chair symptom-free Baseline: Wrist extension 55 on the right and left 62 pounds.  Tightness in the forearm. Goal status: Met  2.  Patient to verbalize and demo 3 modifications and joint protection to be independent in fine motor activities like jewelry and buttons and picking up small objects Baseline: Because of neuropathy in all 5 tips of 5 digits decreased fine motor Goal status: Met    ASSESSMENT:  CLINICAL IMPRESSION: Patient seen  for bilateral carpal tunnel recurrence .  Patient had bilateral carpal tunnel release in the past.  Recent nerve conduction test in May showed moderate to Min carpal tunnel.   AT eval pt present negative Tinel over carpal tunnel and Phalen's.  Patient denies any pain.  Reports some pins-and-needles numbness in the tips of all 5 digits.  Patient is a diabetic.?  Neuropathy.  Patient's active range of motion in wrist and forearm as well as strength is within normal limits except wrist extension.  Thumb palmar radial abduction 5/5 strength as well as opposition to fifth.  Grip and lateral pinch within normal limits for age.  3-point pinch in the lower range for her age but could be limited because of neuropathy in the fingertips of all 5 digits. NOW patient return after doing home exercises for 3 weeks.  Patient returned with reports of no neuropathy or numbness in fingertips.  Patient denies pain.  Patient negative Tinel and Phalen's.  Strength in wrist as well as thumb 5/5.  Grip and prehension strength improved great to 12 percentile for age.  Wrist extension improved.  Patient met all goals.  Recommend for patient to continue with home exercises daily to maintain her progress and prevent symptoms from  returning.  Patient in agreement patient discharged at this time.    PERFORMANCE DEFICITS:  in functional skills including ADLs, IADLs, ROM, strength, pain, flexibility, decreased knowledge of use of DME, and UE functional use,   and psychosocial skills including environmental adaptation and routines and behaviors.   IMPAIRMENTS: are limiting patient from ADLs, IADLs, rest and sleep, play, leisure, and social participation.   COMORBIDITIES: has no other co-morbidities that affects occupational performance. Patient will benefit from skilled OT to address above impairments and improve overall function.  MODIFICATION OR ASSISTANCE TO COMPLETE EVALUATION: No modification of tasks or assist necessary to complete an evaluation.  OT OCCUPATIONAL PROFILE AND HISTORY: Problem focused assessment: Including review of records relating to presenting problem.  CLINICAL DECISION MAKING: LOW - limited treatment options, no task modification necessary  REHAB POTENTIAL: Good for goals  EVALUATION COMPLEXITY: Low    PLAN:  OT FREQUENCY: 1-2x/week  OT DURATION: 6 weeks  PLANNED INTERVENTIONS: 97168 OT Re-evaluation, 97535 self care/ADL training, 02889 therapeutic exercise, 97530 therapeutic activity, 97112 neuromuscular re-education, 97140 manual therapy, 97035 ultrasound, 97034 contrast bath, 97033 iontophoresis, 97760 Orthotic Initial, [id number] Orthotic/Prosthetic subsequent, passive range of motion, patient/family education, and DME and/or AE instructions    CONSULTED AND AGREED WITH PLAN OF CARE: Patient     Ancel Peters, OTR/L,CLT 09/28/2023, 4:39 PM

## 2023-10-28 ENCOUNTER — Ambulatory Visit: Admitting: Neurosurgery

## 2023-11-11 ENCOUNTER — Encounter: Payer: Self-pay | Admitting: Neurosurgery

## 2024-02-09 ENCOUNTER — Encounter: Payer: Self-pay | Admitting: Emergency Medicine

## 2024-02-09 ENCOUNTER — Emergency Department

## 2024-02-09 ENCOUNTER — Emergency Department
Admission: EM | Admit: 2024-02-09 | Discharge: 2024-02-09 | Disposition: A | Discharge status: Home / Self Care | Source: Ambulatory Visit

## 2024-02-09 ENCOUNTER — Other Ambulatory Visit: Payer: Self-pay

## 2024-02-09 DIAGNOSIS — N202 Calculus of kidney with calculus of ureter: Secondary | ICD-10-CM

## 2024-02-09 DIAGNOSIS — N179 Acute kidney failure, unspecified: Secondary | ICD-10-CM | POA: Insufficient documentation

## 2024-02-09 DIAGNOSIS — R1032 Left lower quadrant pain: Secondary | ICD-10-CM | POA: Diagnosis present

## 2024-02-09 DIAGNOSIS — E119 Type 2 diabetes mellitus without complications: Secondary | ICD-10-CM | POA: Diagnosis not present

## 2024-02-09 DIAGNOSIS — N132 Hydronephrosis with renal and ureteral calculous obstruction: Secondary | ICD-10-CM | POA: Diagnosis not present

## 2024-02-09 DIAGNOSIS — I10 Essential (primary) hypertension: Secondary | ICD-10-CM | POA: Insufficient documentation

## 2024-02-09 LAB — URINALYSIS, ROUTINE W REFLEX MICROSCOPIC
Bilirubin Urine: NEGATIVE
Glucose, UA: NEGATIVE mg/dL
Ketones, ur: NEGATIVE mg/dL
Leukocytes,Ua: NEGATIVE
Nitrite: NEGATIVE
Protein, ur: NEGATIVE mg/dL
RBC / HPF: >50 RBC/hpf (ref 0–5)
Specific Gravity, Urine: 1.015 (ref 1.005–1.030)
pH: 5 (ref 5.0–8.0)

## 2024-02-09 LAB — COMPREHENSIVE METABOLIC PANEL WITH GFR
ALT: 18 U/L (ref 0–44)
AST: 21 U/L (ref 15–41)
Albumin: 4.8 g/dL (ref 3.5–5.0)
Alkaline Phosphatase: 59 U/L (ref 38–126)
Anion gap: 13 (ref 5–15)
BUN: 34 mg/dL — ABNORMAL HIGH (ref 8–23)
CO2: 23 mmol/L (ref 22–32)
Calcium: 9.8 mg/dL (ref 8.9–10.3)
Chloride: 102 mmol/L (ref 98–111)
Creatinine, Ser: 1.37 mg/dL — ABNORMAL HIGH (ref 0.44–1.00)
GFR, Estimated: 40 mL/min — ABNORMAL LOW (ref >60)
Glucose, Bld: 136 mg/dL — ABNORMAL HIGH (ref 70–99)
Potassium: 4.4 mmol/L (ref 3.5–5.1)
Sodium: 138 mmol/L (ref 135–145)
Total Bilirubin: 0.4 mg/dL (ref 0.0–1.2)
Total Protein: 7.6 g/dL (ref 6.5–8.1)

## 2024-02-09 LAB — CBC
HCT: 34.7 % — ABNORMAL LOW (ref 36.0–46.0)
Hemoglobin: 11 g/dL — ABNORMAL LOW (ref 12.0–15.0)
MCH: 29.7 pg (ref 26.0–34.0)
MCHC: 31.7 g/dL (ref 30.0–36.0)
MCV: 93.8 fL (ref 80.0–100.0)
Platelets: 231 K/uL (ref 150–400)
RBC: 3.7 MIL/uL — ABNORMAL LOW (ref 3.87–5.11)
RDW: 12.1 % (ref 11.5–15.5)
WBC: 9.3 K/uL (ref 4.0–10.5)
nRBC: 0 % (ref 0.0–0.2)

## 2024-02-09 LAB — LIPASE, BLOOD: Lipase: 23 U/L (ref 11–51)

## 2024-02-09 MED ORDER — TAMSULOSIN HCL 0.4 MG PO CAPS
0.4000 mg | ORAL_CAPSULE | Freq: Once | ORAL | Status: AC
  Administered 2024-02-09: 0.4 mg via ORAL
  Filled 2024-02-09: qty 1

## 2024-02-09 MED ORDER — ONDANSETRON 4 MG PO TBDP: 4.0000 mg | ORAL_TABLET | Freq: Three times a day (TID) | ORAL | 0 refills | Status: AC | PRN

## 2024-02-09 MED ORDER — OXYCODONE HCL 5 MG PO TABS: 5.0000 mg | ORAL_TABLET | Freq: Three times a day (TID) | ORAL | 0 refills | Status: DC | PRN

## 2024-02-09 MED ORDER — MORPHINE SULFATE (PF) 2 MG/ML IV SOLN
2.0000 mg | Freq: Once | INTRAVENOUS | Status: AC
  Administered 2024-02-09: 2 mg via INTRAVENOUS
  Filled 2024-02-09: qty 1

## 2024-02-09 MED ORDER — OXYCODONE HCL 5 MG PO TABS
5.0000 mg | ORAL_TABLET | Freq: Once | ORAL | Status: AC
  Administered 2024-02-09: 5 mg via ORAL
  Filled 2024-02-09: qty 1

## 2024-02-09 MED ORDER — ACETAMINOPHEN 500 MG PO TABS: 1000.0000 mg | ORAL_TABLET | Freq: Four times a day (QID) | ORAL | 2 refills | Status: AC | PRN

## 2024-02-09 MED ORDER — SODIUM CHLORIDE 0.9 % IV BOLUS
1000.0000 mL | Freq: Once | INTRAVENOUS | Status: AC
  Administered 2024-02-09: 1000 mL via INTRAVENOUS

## 2024-02-09 MED ORDER — TAMSULOSIN HCL 0.4 MG PO CAPS: 0.4000 mg | ORAL_CAPSULE | Freq: Every day | ORAL | 0 refills | Status: AC

## 2024-02-09 MED ORDER — ONDANSETRON 4 MG PO TBDP: 4.0000 mg | ORAL_TABLET | Freq: Once | ORAL | Status: DC | PRN

## 2024-02-09 MED ORDER — ONDANSETRON HCL 4 MG/2ML IJ SOLN
4.0000 mg | Freq: Once | INTRAMUSCULAR | Status: AC
  Administered 2024-02-09: 4 mg via INTRAVENOUS
  Filled 2024-02-09: qty 2

## 2024-02-09 MED ORDER — KETOROLAC TROMETHAMINE 15 MG/ML IJ SOLN
15.0000 mg | Freq: Once | INTRAMUSCULAR | Status: AC
  Administered 2024-02-09: 15 mg via INTRAVENOUS
  Filled 2024-02-09: qty 1

## 2024-02-09 NOTE — ED Provider Notes (Signed)
 [facility] Provider Note    Event Date/Time   First MD Initiated Contact with Patient 02/09/24 1911     (approximate)   History   Abdominal Pain  Patient c/o left lower abdominal pain onset of 4 pm today with nausea and emesis.    HPI Anna Castro is a 73 y.o. female PMH diabetes, hypertension, hyperlipidemia, GERD presents for evaluation of lower abdominal pain - Acute onset today.  Left flank radiating to left inguinal region. -Does have a history of a prior kidney stone -No fever, no urinary symptoms -Pain is severe -No preceding trauma     Physical Exam   Triage Vital Signs: ED Triage Vitals  Encounter Vitals Group     BP 02/09/24 1807 (!) 163/68     Girls Systolic BP Percentile --      Girls Diastolic BP Percentile --      Boys Systolic BP Percentile --      Boys Diastolic BP Percentile --      Pulse Rate 02/09/24 1807 68     Resp 02/09/24 1807 18     Temp 02/09/24 1807 98.1 F (36.7 C)     Temp Source 02/09/24 1807 Oral     SpO2 02/09/24 1807 97 %     Weight 02/09/24 1806 196 lb (88.9 kg)     Height 02/09/24 1806 5' 2 (1.575 m)     Head Circumference --      Peak Flow --      Pain Score 02/09/24 1806 7     Pain Loc --      Pain Education --      Exclude from Growth Chart --     Most recent vital signs: Vitals:   02/09/24 1807 02/09/24 2237  BP: (!) 163/68 (!) 150/60  Pulse: 68 70  Resp: 18 18  Temp: 98.1 F (36.7 C) 98.4 F (36.9 C)  SpO2: 97% 98%     General: Awake, appears uncomfortable but nontoxic CV:  Good peripheral perfusion. RRR, RP 2+ Resp:  Normal effort. CTAB Abd:  No distention.  My tenderness palpation left inguinal/lower quadrant region.  No hernias appreciated.  No CVA tenderness bilaterally.     ED Results / Procedures / Treatments   Labs (all labs ordered are listed, but only abnormal results are displayed) Labs Reviewed  COMPREHENSIVE METABOLIC PANEL WITH GFR - Abnormal; Notable for  the following components:      Result Value   Glucose, Bld 136 (*)    BUN 34 (*)    Creatinine, Ser 1.37 (*)    GFR, Estimated 40 (*)    All other components within normal limits  CBC - Abnormal; Notable for the following components:   RBC 3.70 (*)    Hemoglobin 11.0 (*)    HCT 34.7 (*)    All other components within normal limits  URINALYSIS, ROUTINE W REFLEX MICROSCOPIC - Abnormal; Notable for the following components:   Color, Urine STRAW (*)    APPearance CLEAR (*)    Hgb urine dipstick LARGE (*)    Bacteria, UA RARE (*)    All other components within normal limits  LIPASE, BLOOD     EKG  N/a   RADIOLOGY Radiology interpreted by myself and radiology report reviewed.  8mm left UVJ stone with moderate hydro.    PROCEDURES:  Critical Care performed: No  Procedures   MEDICATIONS ORDERED IN ED: Medications  morphine  (PF) 2 MG/ML injection 2 mg (2  mg Intravenous Given 02/09/24 2049)  ketorolac (TORADOL) 15 MG/ML injection 15 mg (15 mg Intravenous Given 02/09/24 2049)  sodium chloride  0.9 % bolus 1,000 mL (0 mLs Intravenous Stopped 02/09/24 2237)  ondansetron  (ZOFRAN ) injection 4 mg (4 mg Intravenous Given 02/09/24 2050)  tamsulosin  (FLOMAX ) capsule 0.4 mg (0.4 mg Oral Given 02/09/24 2207)  ondansetron  (ZOFRAN ) injection 4 mg (4 mg Intravenous Given 02/09/24 2214)  oxyCODONE  (Oxy IR/ROXICODONE ) immediate release tablet 5 mg (5 mg Oral Given 02/09/24 2214)     IMPRESSION / MDM / ASSESSMENT AND PLAN / ED COURSE  I reviewed the triage vital signs and the nursing notes.                              DDX/MDM/AP: Differential diagnosis includes, but is not limited to, urolithiasis, unlikely diverticulitis, consider MSK strain, doubt pancreatitis, do not suspect other intra-abdominal pathology at this time.  Consider UTI/pyelonephritis.  Plan: - Labs  -pain control -CT abdomen pelvis  Patient's presentation is most consistent with acute presentation with potential threat  to life or bodily function.   ED course below.  Urinalysis with no evidence of infection.  Labs with mild AKI.  CT abdomen pelvis with 8 mm left UPJ stone and moderate hydro.  Pain very well-controlled here in emergency department and patient is amenable to discharge home with outpatient urology follow-up. Rx flomax , tylenol , oxycodone , zofran . Counseled on oral hydration. Urine strainer provided. ED return precautions in place.   Clinical Course as of 02/09/24 2332  Wed Feb 09, 2024  1937 CMP with mild bump in creatinine, otherwise unremarkable [MM]  1937 CBC with no leukocytosis, hemoglobin stable from 2 days ago  Lipase normal [MM]  2027 Urinalysis with significant hematuria suggestive of urolithiasis  No evidence of infection  CT pending [MM]  2028 CMP with AKI, giving IV fluid [MM]  2155 CTAP: IMPRESSION: 1. Moderate left hydronephrosis secondary to an obstructing 8 mm calculus at the left ureteropelvic junction, with mild left perinephric stranding. 2. Extensive bilateral nonobstructing nephrolithiasis, largest measuring 18 mm in the right lower pole and 10 mm in the left lower pole. 3. Mild distal colonic diverticulosis without evidence of diverticulitis.   [MM]    Clinical Course User Index [MM] Clarine Ozell LABOR, MD     FINAL CLINICAL IMPRESSION(S) / ED DIAGNOSES   Final diagnoses:  Calculus of kidney with calculus of ureter  AKI (acute kidney injury)     Rx / DC Orders   ED Discharge Orders          Ordered    tamsulosin  (FLOMAX ) 0.4 MG CAPS capsule  Daily        02/09/24 2218    acetaminophen  (TYLENOL ) 500 MG tablet  Every 6 hours PRN        02/09/24 2218    oxyCODONE  (ROXICODONE ) 5 MG immediate release tablet  Every 8 hours PRN        02/09/24 2218    ondansetron  (ZOFRAN -ODT) 4 MG disintegrating tablet  Every 8 hours PRN        02/09/24 2218    Ambulatory referral to Urology       Comments: 8mm L urolithiasis w/ hydronephrosis, +aki   02/09/24 2219              Note:  This document was prepared using Dragon voice recognition software and may include unintentional dictation errors.   Clarine Ozell LABOR, MD 02/09/24 [phone]

## 2024-02-09 NOTE — Discharge Instructions (Addendum)
 Your evaluation in the emergency department was notable for a left-sided 8 mm kidney stone.  It is possible this will pass on its own, and I have prescribed you a medication (tamsulosin ) to help with this.  I have also prescribed you Tylenol  and oxycodone  to use as needed for any ongoing pain as well as a nausea medication (Zofran ).  I placed a referral for you to follow-up with a urologist--they will contact you to schedule appointment.  We have provided you a urine strainer that you can use when urinating to attempt to catch the stone--if you do, please bring it to your urology appointment.  Please follow-up with your primary care provider in addition, and return to the emergency department with any new or worsening symptoms including fever, inability to urinate, uncontrollable pain, or any other symptoms concerning to you.

## 2024-02-09 NOTE — ED Triage Notes (Signed)
 Patient c/o left lower abdominal pain onset of 4 pm today with nausea and emesis.

## 2024-02-14 ENCOUNTER — Telehealth: Payer: Self-pay

## 2024-02-14 ENCOUNTER — Encounter: Payer: Self-pay | Admitting: Urology

## 2024-02-14 ENCOUNTER — Other Ambulatory Visit: Payer: Self-pay

## 2024-02-14 ENCOUNTER — Ambulatory Visit: Admitting: Urology

## 2024-02-14 VITALS — BP 140/80 | HR 72 | Ht 62.0 in | Wt 200.0 lb

## 2024-02-14 DIAGNOSIS — N201 Calculus of ureter: Secondary | ICD-10-CM

## 2024-02-14 DIAGNOSIS — N132 Hydronephrosis with renal and ureteral calculous obstruction: Secondary | ICD-10-CM

## 2024-02-14 DIAGNOSIS — N200 Calculus of kidney: Secondary | ICD-10-CM

## 2024-02-14 DIAGNOSIS — N23 Unspecified renal colic: Secondary | ICD-10-CM

## 2024-02-14 NOTE — Telephone Encounter (Signed)
 Per Dr. Twylla, Patient is to be scheduled for Left Ureteroscopy with Laser Lithotripsy and Stent Placement   Mrs. Waldren was contacted and possible surgical dates were discussed, Thursday December 11th, 2025 was agreed upon for surgery.   Patient was directed to call [phone] between 1-3pm the day before surgery to find out surgical arrival time.  Instructions were given not to eat or drink from midnight on the night before surgery and have a driver for the day of surgery. On the surgery day patient was instructed to enter through the [facility] entrance of [facility] report the Same Day Surgery desk.   Pre-Admit Testing will be in contact via phone to set up an interview with the anesthesia team to review your history and medications prior to surgery.   Reminder of this information was given to the patient via phone.

## 2024-02-14 NOTE — Progress Notes (Signed)
 02/14/2024 12:40 PM   Gwendolen Aleta Comes 02/21/1951 [id number]  Referring provider: Auston Reyes BIRCH, MD [address] [facility] [place],  [place] [address]  Chief Complaint  Patient presents with   Nephrolithiasis    HPI: Terrionna Cecilia Vancleve is a 73 y.o. female presents for follow-up of recent ED visit for renal colic.  [facility] ED visit 02/09/2024 complaining of left lower quadrant abdominal pain associated with nausea and vomiting.  Some radiation to left inguinal region Evaluation in the ED remarkable for UA with microhematuria and a CT renal stone study showing bilateral nonobstructing renal calculi and an 8 mm obstructing left proximal ureteral calculus Pain was controlled with parenteral analgesics/antiemetics.  She was discharged from the ED with Rxs for tamsulosin , oxycodone  IR and Zofran  and ODT Since her ED visit she has had intermittent left lower quadrant abdominal pain which she has tried to manage with Tylenol  as she gets nausea with oxycodone .  She is not taking the Zofran  sublingually No fever or chills   PMH: Past Medical History:  Diagnosis Date   Anemia    DDD (degenerative disc disease), cervical    DDD (degenerative disc disease), thoracic    Diabetes mellitus without complication (HCC)    GERD (gastroesophageal reflux disease)    Heart murmur    History of kidney stones    Hyperlipidemia    Hypertension    Osteoporosis    Sarcoid    Spinal stenosis of cervical region     Surgical History: Past Surgical History:  Procedure Laterality Date   CARPAL TUNNEL RELEASE Left 2001   CARPAL TUNNEL RELEASE Right 2007   CARPAL TUNNEL RELEASE Left 05/28/2020   Procedure: CARPAL TUNNEL RELEASE;  Surgeon: Kathlynn Sharper, MD;  Location: [facility];  Service: Orthopedics;  Laterality: Left;   CARPAL TUNNEL RELEASE Right 05/29/2021   Procedure: Right carpal tunnel release;  Surgeon: Kathlynn Sharper, MD;  Location: [facility];  Service: Orthopedics;   Laterality: Right;   COLONOSCOPY  02/19/1997, 09/16/2001, 10/08/2006,   COLONOSCOPY WITH PROPOFOL  N/A 01/19/2018   Procedure: COLONOSCOPY WITH PROPOFOL ;  Surgeon: Toledo, Ladell POUR, MD;  Location: [facility];  Service: Gastroenterology;  Laterality: N/A;   ESOPHAGOGASTRODUODENOSCOPY  10/08/2006   TUBAL LIGATION      Home Medications:  Allergies as of 02/14/2024   No Known Allergies      Medication List        Accurate as of February 14, 2024 12:40 PM. If you have any questions, ask your nurse or doctor.          acetaminophen  500 MG tablet Commonly known as: TYLENOL  Take 500-1,000 mg by mouth every 8 (eight) hours as needed for moderate pain.   acetaminophen  500 MG tablet Commonly known as: TYLENOL  Take 2 tablets (1,000 mg total) by mouth every 6 (six) hours as needed.   glimepiride 4 MG tablet Commonly known as: AMARYL Take 4 mg by mouth every morning.   hydrochlorothiazide 12.5 MG tablet Commonly known as: HYDRODIURIL Take 12.5 mg by mouth daily.   lansoprazole 30 MG capsule Commonly known as: PREVACID Take 30 mg by mouth daily as needed (acid reflux).   losartan 100 MG tablet Commonly known as: COZAAR Take 100 mg by mouth daily.   ondansetron  4 MG disintegrating tablet Commonly known as: ZOFRAN -ODT Take 1 tablet (4 mg total) by mouth every 8 (eight) hours as needed for nausea or vomiting.   oxyCODONE  5 MG immediate release tablet Commonly known as: Roxicodone  Take  1 tablet (5 mg total) by mouth every 8 (eight) hours as needed for breakthrough pain.   rosuvastatin 10 MG tablet Commonly known as: CRESTOR Take 10 mg by mouth daily.   tamsulosin  0.4 MG Caps capsule Commonly known as: Flomax  Take 1 capsule (0.4 mg total) by mouth daily for 14 days.   timolol 0.5 % ophthalmic solution Commonly known as: TIMOPTIC Place 1 drop into both eyes in the morning and at bedtime.   Vitamin D (Ergocalciferol) 1.25 MG (50000 UNIT) Caps capsule Commonly known as:  DRISDOL Take 50,000 Units by mouth every 7 (seven) days. Sunday        Allergies: No Known Allergies  Family History: Family History  Problem Relation Age of Onset   Breast cancer Mother 20   Kidney disease Mother     Social History:  reports that she has never smoked. She has never used smokeless tobacco. She reports that she does not drink alcohol and does not use drugs.   Physical Exam: BP (!) 140/80   Pulse 72   Ht 5' 2 (1.575 m)   Wt 200 lb (90.7 kg)   BMI 36.58 kg/m   Constitutional:  Alert, No acute distress. HEENT: [place] AT Respiratory: Normal respiratory effort, no increased work of breathing. Psychiatric: Normal mood and affect.  Laboratory Data:  Urinalysis Dipstick trace ketone/1+ blood Microscopy 6-10 WBC/11-30 RBC/>10 epis   Pertinent Imaging: CT images renal stone study 02/09/2024 were personally reviewed and interpreted   CT RENAL STONE STUDY  Narrative EXAM: CT UROGRAM 02/09/2024 09:10:18 PM  TECHNIQUE: CT of the abdomen and pelvis was performed without the administration of intravenous contrast. Multiplanar reformatted images as well as MIP urogram images are provided for review. Automated exposure control, iterative reconstruction, and/or weight based adjustment of the mA/kV was utilized to reduce the radiation dose to as low as reasonably achievable.  COMPARISON: Comparison made to 11/10/2017.  CLINICAL HISTORY: Left lower quadrant pain /mid abdominal pain --eval for urolithiasis, diverticulitis, other pathology.  FINDINGS:  LOWER CHEST: No acute abnormality.  LIVER: The liver is unremarkable.  GALLBLADDER AND BILE DUCTS: Gallbladder is unremarkable. No biliary ductal dilatation.  SPLEEN: No acute abnormality.  PANCREAS: No acute abnormality.  ADRENAL GLANDS: No acute abnormality.  KIDNEYS, URETERS AND BLADDER: The kidneys are normal in size and position. Extensive bilateral nonobstructing nephrolithiasis with  calculi measuring up to 18 mm within the lower pole of the right kidney and 10 mm within the lower pole of the left kidney. Moderate left hydronephrosis secondary to an obstructing 8 mm calculus within the left ureteropelvic junction. Mild left perinephric stranding. No additional ureteral calculi. No hydronephrosis on the right. No perinephric fluid collections. A simple cortical cyst arises from the lower pole of the right kidney, for which no follow-up imaging is recommended. Bladder unremarkable.  GI AND BOWEL: Mild distal colonic diverticulosis. The stomach, small bowel, and large bowel are otherwise unremarkable. Appendix normal.  PERITONEUM AND RETROPERITONEUM: No ascites. No free air.  VASCULATURE: Extensive aortoiliac atherosclerotic calcification. No aortic aneurysm.  LYMPH NODES: No lymphadenopathy.  REPRODUCTIVE ORGANS: No acute abnormality.  BONES AND SOFT TISSUES: No acute osseous abnormality. No focal soft tissue abnormality.  IMPRESSION: 1. Moderate left hydronephrosis secondary to an obstructing 8 mm calculus at the left ureteropelvic junction, with mild left perinephric stranding. 2. Extensive bilateral nonobstructing nephrolithiasis, largest measuring 18 mm in the right lower pole and 10 mm in the left lower pole. 3. Mild distal colonic diverticulosis without evidence of diverticulitis.  Electronically signed by: Dorethia Molt MD 02/09/2024 09:48 PM EST RP Workstation: [id number]   Assessment & Plan:    1. Left ureteral calculus We discussed various treatment options for urolithiasis including observation with or without medical expulsive therapy, shockwave lithotripsy (SWL), ureteroscopy and laser lithotripsy with stent placement. We discussed that management is based on stone size, location, density, patient co-morbidities, and patient preference.  Stones <12mm in size have a >80% spontaneous passage rate. Data surrounding the use of tamsulosin  for  medical expulsive therapy is controversial, but meta analyses suggests it is most efficacious for distal stones between 5-5mm in size.  SWL has a lower stone free rate in a single procedure, but also a lower complication rate compared to ureteroscopy and avoids a stent and associated stent related symptoms. Possible complications include renal hematoma, steinstrasse, and need for additional treatment. Ureteroscopy with laser lithotripsy and stent placement has a higher stone free rate than SWL in a single procedure, however increased complication rate including possible infection, ureteral injury, bleeding, and stent related morbidity. Common stent related symptoms include dysuria, urgency/frequency, and flank pain.  We also discussed possibility the stone may not be able to be treated due to ureteral and anatomy and inability to access the upper ureter with the ureteroscope.  If this were to occur a stent would be placed and she would need follow-up procedure after passive stent dilation After an extensive discussion of the risks and benefits of the above treatment options, the patient would like to proceed with ureteroscopy/laser lithotripsy.  2. Bilateral nephrolithiasis Will plan on clearing her left renal calculi  3. Renal colic Take Zofran  sublingually Continue oxycodone  as needed  4. Hydronephrosis with urinary obstruction due to ureteral calculus   Glendia JAYSON Barba, MD  [facility] [address] [place], [place] [address] [phone]

## 2024-02-14 NOTE — Progress Notes (Signed)
   [facility] Surgical Posting Form  Surgery Date: Date: 02/17/2024  Surgeon: Dr. Glendia Barba, MD  Inpt ( No  )   Outpt (Yes)   Obs ( No  )   Diagnosis: N20.1 Left Ureteral Stone, N20.0 Left Nephrolithiasis  -CPT: [phone]  Surgery: Left Ureteroscopy with Laser Lithotripsy and Stent Placement   Stop Anticoagulations: No  Cardiac/Medical/Pulmonary Clearance needed: no  *Orders entered into EPIC  Date: 02/14/24   *Case booked in [place]  Date: 02/14/24  *Notified pt of Surgery: Date: 02/14/24  PRE-OP UA & CX: no  *Placed into Prior Authorization Work [place] Date: 02/14/24  Assistant/laser/rep:No

## 2024-02-14 NOTE — H&P (View-Only) (Signed)
 02/14/2024 12:40 PM   Anna Castro 02/21/1951 [id number]  Referring provider: Auston Reyes BIRCH, MD [address] [facility] [place],  [place] [address]  Chief Complaint  Patient presents with   Nephrolithiasis    HPI: Anna Castro is a 73 y.o. female presents for follow-up of recent ED visit for renal colic.  [facility] ED visit 02/09/2024 complaining of left lower quadrant abdominal pain associated with nausea and vomiting.  Some radiation to left inguinal region Evaluation in the ED remarkable for UA with microhematuria and a CT renal stone study showing bilateral nonobstructing renal calculi and an 8 mm obstructing left proximal ureteral calculus Pain was controlled with parenteral analgesics/antiemetics.  She was discharged from the ED with Rxs for tamsulosin , oxycodone  IR and Zofran  and ODT Since her ED visit she has had intermittent left lower quadrant abdominal pain which she has tried to manage with Tylenol  as she gets nausea with oxycodone .  She is not taking the Zofran  sublingually No fever or chills   PMH: Past Medical History:  Diagnosis Date   Anemia    DDD (degenerative disc disease), cervical    DDD (degenerative disc disease), thoracic    Diabetes mellitus without complication (HCC)    GERD (gastroesophageal reflux disease)    Heart murmur    History of kidney stones    Hyperlipidemia    Hypertension    Osteoporosis    Sarcoid    Spinal stenosis of cervical region     Surgical History: Past Surgical History:  Procedure Laterality Date   CARPAL TUNNEL RELEASE Left 2001   CARPAL TUNNEL RELEASE Right 2007   CARPAL TUNNEL RELEASE Left 05/28/2020   Procedure: CARPAL TUNNEL RELEASE;  Surgeon: Kathlynn Sharper, MD;  Location: [facility];  Service: Orthopedics;  Laterality: Left;   CARPAL TUNNEL RELEASE Right 05/29/2021   Procedure: Right carpal tunnel release;  Surgeon: Kathlynn Sharper, MD;  Location: [facility];  Service: Orthopedics;   Laterality: Right;   COLONOSCOPY  02/19/1997, 09/16/2001, 10/08/2006,   COLONOSCOPY WITH PROPOFOL  N/A 01/19/2018   Procedure: COLONOSCOPY WITH PROPOFOL ;  Surgeon: Toledo, Ladell POUR, MD;  Location: [facility];  Service: Gastroenterology;  Laterality: N/A;   ESOPHAGOGASTRODUODENOSCOPY  10/08/2006   TUBAL LIGATION      Home Medications:  Allergies as of 02/14/2024   No Known Allergies      Medication List        Accurate as of February 14, 2024 12:40 PM. If you have any questions, ask your nurse or doctor.          acetaminophen  500 MG tablet Commonly known as: TYLENOL  Take 500-1,000 mg by mouth every 8 (eight) hours as needed for moderate pain.   acetaminophen  500 MG tablet Commonly known as: TYLENOL  Take 2 tablets (1,000 mg total) by mouth every 6 (six) hours as needed.   glimepiride 4 MG tablet Commonly known as: AMARYL Take 4 mg by mouth every morning.   hydrochlorothiazide 12.5 MG tablet Commonly known as: HYDRODIURIL Take 12.5 mg by mouth daily.   lansoprazole 30 MG capsule Commonly known as: PREVACID Take 30 mg by mouth daily as needed (acid reflux).   losartan 100 MG tablet Commonly known as: COZAAR Take 100 mg by mouth daily.   ondansetron  4 MG disintegrating tablet Commonly known as: ZOFRAN -ODT Take 1 tablet (4 mg total) by mouth every 8 (eight) hours as needed for nausea or vomiting.   oxyCODONE  5 MG immediate release tablet Commonly known as: Roxicodone  Take  1 tablet (5 mg total) by mouth every 8 (eight) hours as needed for breakthrough pain.   rosuvastatin 10 MG tablet Commonly known as: CRESTOR Take 10 mg by mouth daily.   tamsulosin  0.4 MG Caps capsule Commonly known as: Flomax  Take 1 capsule (0.4 mg total) by mouth daily for 14 days.   timolol 0.5 % ophthalmic solution Commonly known as: TIMOPTIC Place 1 drop into both eyes in the morning and at bedtime.   Vitamin D (Ergocalciferol) 1.25 MG (50000 UNIT) Caps capsule Commonly known as:  DRISDOL Take 50,000 Units by mouth every 7 (seven) days. Sunday        Allergies: No Known Allergies  Family History: Family History  Problem Relation Age of Onset   Breast cancer Mother 20   Kidney disease Mother     Social History:  reports that she has never smoked. She has never used smokeless tobacco. She reports that she does not drink alcohol and does not use drugs.   Physical Exam: BP (!) 140/80   Pulse 72   Ht 5' 2 (1.575 m)   Wt 200 lb (90.7 kg)   BMI 36.58 kg/m   Constitutional:  Alert, No acute distress. HEENT: [place] AT Respiratory: Normal respiratory effort, no increased work of breathing. Psychiatric: Normal mood and affect.  Laboratory Data:  Urinalysis Dipstick trace ketone/1+ blood Microscopy 6-10 WBC/11-30 RBC/>10 epis   Pertinent Imaging: CT images renal stone study 02/09/2024 were personally reviewed and interpreted   CT RENAL STONE STUDY  Narrative EXAM: CT UROGRAM 02/09/2024 09:10:18 PM  TECHNIQUE: CT of the abdomen and pelvis was performed without the administration of intravenous contrast. Multiplanar reformatted images as well as MIP urogram images are provided for review. Automated exposure control, iterative reconstruction, and/or weight based adjustment of the mA/kV was utilized to reduce the radiation dose to as low as reasonably achievable.  COMPARISON: Comparison made to 11/10/2017.  CLINICAL HISTORY: Left lower quadrant pain /mid abdominal pain --eval for urolithiasis, diverticulitis, other pathology.  FINDINGS:  LOWER CHEST: No acute abnormality.  LIVER: The liver is unremarkable.  GALLBLADDER AND BILE DUCTS: Gallbladder is unremarkable. No biliary ductal dilatation.  SPLEEN: No acute abnormality.  PANCREAS: No acute abnormality.  ADRENAL GLANDS: No acute abnormality.  KIDNEYS, URETERS AND BLADDER: The kidneys are normal in size and position. Extensive bilateral nonobstructing nephrolithiasis with  calculi measuring up to 18 mm within the lower pole of the right kidney and 10 mm within the lower pole of the left kidney. Moderate left hydronephrosis secondary to an obstructing 8 mm calculus within the left ureteropelvic junction. Mild left perinephric stranding. No additional ureteral calculi. No hydronephrosis on the right. No perinephric fluid collections. A simple cortical cyst arises from the lower pole of the right kidney, for which no follow-up imaging is recommended. Bladder unremarkable.  GI AND BOWEL: Mild distal colonic diverticulosis. The stomach, small bowel, and large bowel are otherwise unremarkable. Appendix normal.  PERITONEUM AND RETROPERITONEUM: No ascites. No free air.  VASCULATURE: Extensive aortoiliac atherosclerotic calcification. No aortic aneurysm.  LYMPH NODES: No lymphadenopathy.  REPRODUCTIVE ORGANS: No acute abnormality.  BONES AND SOFT TISSUES: No acute osseous abnormality. No focal soft tissue abnormality.  IMPRESSION: 1. Moderate left hydronephrosis secondary to an obstructing 8 mm calculus at the left ureteropelvic junction, with mild left perinephric stranding. 2. Extensive bilateral nonobstructing nephrolithiasis, largest measuring 18 mm in the right lower pole and 10 mm in the left lower pole. 3. Mild distal colonic diverticulosis without evidence of diverticulitis.  Electronically signed by: Dorethia Molt MD 02/09/2024 09:48 PM EST RP Workstation: [id number]   Assessment & Plan:    1. Left ureteral calculus We discussed various treatment options for urolithiasis including observation with or without medical expulsive therapy, shockwave lithotripsy (SWL), ureteroscopy and laser lithotripsy with stent placement. We discussed that management is based on stone size, location, density, patient co-morbidities, and patient preference.  Stones <12mm in size have a >80% spontaneous passage rate. Data surrounding the use of tamsulosin  for  medical expulsive therapy is controversial, but meta analyses suggests it is most efficacious for distal stones between 5-5mm in size.  SWL has a lower stone free rate in a single procedure, but also a lower complication rate compared to ureteroscopy and avoids a stent and associated stent related symptoms. Possible complications include renal hematoma, steinstrasse, and need for additional treatment. Ureteroscopy with laser lithotripsy and stent placement has a higher stone free rate than SWL in a single procedure, however increased complication rate including possible infection, ureteral injury, bleeding, and stent related morbidity. Common stent related symptoms include dysuria, urgency/frequency, and flank pain.  We also discussed possibility the stone may not be able to be treated due to ureteral and anatomy and inability to access the upper ureter with the ureteroscope.  If this were to occur a stent would be placed and she would need follow-up procedure after passive stent dilation After an extensive discussion of the risks and benefits of the above treatment options, the patient would like to proceed with ureteroscopy/laser lithotripsy.  2. Bilateral nephrolithiasis Will plan on clearing her left renal calculi  3. Renal colic Take Zofran  sublingually Continue oxycodone  as needed  4. Hydronephrosis with urinary obstruction due to ureteral calculus   Glendia JAYSON Barba, MD  [facility] [address] [place], [place] [address] [phone]

## 2024-02-14 NOTE — Progress Notes (Signed)
 Surgical Physician Order Form [facility] Urology Anna Castro  Dr. Glendia Barba, MD  * Scheduling expectation : Thursday 12/11  *Length of Case: 90 minutes  *Clearance needed: no  *Anticoagulation Instructions: N/A  *Aspirin Instructions: N/A  *Post-op visit Date/Instructions:  1 week cysto stent removal  *Diagnosis: Left Ureteral Stone/left nephrolithiasis  *Procedure: left Ureteroscopy w/laser lithotripsy & stent placement (47643)   Additional orders: N/A  -Admit type: OUTpatient  -Anesthesia: General  -VTE Prophylaxis Standing Order SCD's       Other:   -Standing Lab Orders Per Anesthesia    Lab other: None  -Standing Test orders EKG/Chest x-ray per Anesthesia       Test other:   - Medications:  Ceftriaxone (Rocephin ) 1gm IV  -Other orders:  N/A

## 2024-02-15 ENCOUNTER — Other Ambulatory Visit: Payer: Self-pay | Admitting: Cardiovascular Disease

## 2024-02-15 DIAGNOSIS — I700 Atherosclerosis of aorta: Secondary | ICD-10-CM

## 2024-02-15 DIAGNOSIS — I2089 Other forms of angina pectoris: Secondary | ICD-10-CM

## 2024-02-15 DIAGNOSIS — R0789 Other chest pain: Secondary | ICD-10-CM

## 2024-02-15 LAB — MICROSCOPIC EXAMINATION: Epithelial Cells (non renal): >10 /HPF — AB (ref 0–10)

## 2024-02-15 LAB — URINALYSIS, COMPLETE
Bilirubin, UA: NEGATIVE
Glucose, UA: NEGATIVE
Ketones, UA: NEGATIVE
Nitrite, UA: NEGATIVE
Protein,UA: NEGATIVE
Specific Gravity, UA: 1.025 (ref 1.005–1.030)
Urobilinogen, Ur: 0.2 mg/dL (ref 0.2–1.0)
pH, UA: 6 (ref 5.0–7.5)

## 2024-02-16 ENCOUNTER — Inpatient Hospital Stay: Admission: RE | Admit: 2024-02-16 | Discharge: 2024-02-16 | Attending: Urology | Admitting: Urology

## 2024-02-16 ENCOUNTER — Other Ambulatory Visit: Payer: Self-pay

## 2024-02-16 VITALS — Ht 62.5 in | Wt 198.0 lb

## 2024-02-16 DIAGNOSIS — E119 Type 2 diabetes mellitus without complications: Secondary | ICD-10-CM

## 2024-02-16 DIAGNOSIS — N200 Calculus of kidney: Secondary | ICD-10-CM

## 2024-02-16 MED ORDER — CHLORHEXIDINE GLUCONATE 0.12 % MT SOLN
15.0000 mL | Freq: Once | OROMUCOSAL | Status: AC
  Administered 2024-02-17: 15 mL via OROMUCOSAL

## 2024-02-16 MED ORDER — ACETAMINOPHEN 500 MG PO TABS
1000.0000 mg | ORAL_TABLET | Freq: Once | ORAL | Status: AC
  Administered 2024-02-17: 1000 mg via ORAL

## 2024-02-16 MED ORDER — ORAL CARE MOUTH RINSE: 15.0000 mL | Freq: Once | OROMUCOSAL | Status: AC

## 2024-02-16 MED ORDER — SODIUM CHLORIDE 0.9 % IV SOLN: INTRAVENOUS | Status: DC

## 2024-02-16 MED ORDER — SODIUM CHLORIDE 0.9 % IV SOLN
1.0000 g | INTRAVENOUS | Status: AC
  Administered 2024-02-17: 1 g via INTRAVENOUS
  Filled 2024-02-16: qty 10

## 2024-02-16 NOTE — Patient Instructions (Addendum)
 Your procedure is scheduled on: Thursday 02/17/24 Report to the Registration Desk on the 1st floor of the [facility]. To find out your arrival time, please call [phone] between 1PM - 3PM on: Wednesday 02/16/24 If your arrival time is 6:00 am, do not arrive before that time as the [facility] entrance doors do not open until 6:00 am.  REMEMBER: Instructions that are not followed completely may result in serious medical risk, up to and including death; or upon the discretion of your surgeon and anesthesiologist your surgery may need to be rescheduled.  Do not eat food or drink fluids after midnight the night before surgery.  No gum chewing or hard candies.  One week prior to surgery: Stop Anti-inflammatories (NSAIDS) such as Advil, Aleve, Ibuprofen, meloxicam (MOBIC), Motrin, Naproxen, Naprosyn and Aspirin based products such as Excedrin, Goody's Powder, BC Powder. Stop ANY OVER THE COUNTER supplements until after surgery.  You may however, continue to take Tylenol  if needed for pain up until the day of surgery.  Continue taking all of your other prescription medications up until the day of surgery.  ON THE DAY OF SURGERY ONLY TAKE THESE MEDICATIONS WITH SIPS OF WATER:  gabapentin (NEURONTIN) 300 MG  isosorbide mononitrate (IMDUR) 30 MG  tamsulosin  (FLOMAX ) 0.4 MG  metoprolol succinate (TOPROL-XL) 25 MG   No Alcohol for 24 hours before or after surgery.  No Smoking including e-cigarettes for 24 hours before surgery.  No chewable tobacco products for at least 6 hours before surgery.  No nicotine patches on the day of surgery.  Do not use any recreational drugs for at least a week (preferably 2 weeks) before your surgery.  Please be advised that the combination of cocaine and anesthesia may have negative outcomes, up to and including death. If you test positive for cocaine, your surgery will be cancelled.  On the morning of surgery brush your teeth with toothpaste and  water, you may rinse your mouth with mouthwash if you wish. Do not swallow any toothpaste or mouthwash.  Take a fresh shower, the morning of your procedure.  Do not wear jewelry, make-up, hairpins, clips or nail polish.  For welded (permanent) jewelry: bracelets, anklets, waist bands, etc.  Please have this removed prior to surgery.  If it is not removed, there is a chance that hospital personnel will need to cut it off on the day of surgery.  Do not wear lotions, powders, or perfumes.   Do not shave body hair from the neck down 48 hours before surgery.  Contact lenses, hearing aids and dentures may not be worn into surgery.  Do not bring valuables to the hospital. [facility] is not responsible for any missing/lost belongings or valuables.   Notify your doctor if there is any change in your medical condition (cold, fever, infection).  Wear comfortable clothing (specific to your surgery type) to the hospital.  After surgery, you can help prevent lung complications by doing breathing exercises.  Take deep breaths and cough every 1-2 hours. Your doctor may order a device called an Incentive Spirometer to help you take deep breaths. When coughing or sneezing, hold a pillow firmly against your incision with both hands. This is called splinting. Doing this helps protect your incision. It also decreases belly discomfort.  If you are being admitted to the hospital overnight, leave your suitcase in the car. After surgery it may be brought to your room.  In case of increased patient census, it may be necessary for you,  the patient, to continue your postoperative care in the Same Day Surgery department.  If you are being discharged the day of surgery, you will not be allowed to drive home. You will need a responsible individual to drive you home and stay with you for 24 hours after surgery.   If you are taking public transportation, you will need to have a responsible individual with  you.  Please call the Pre-admissions Testing Dept. at [phone] if you have any questions about these instructions.  Surgery Visitation Policy:  Patients having surgery or a procedure may have two visitors.  Children under the age of 52 must have an adult with them who is not the patient.  Inpatient Visitation:    Visiting hours are 7 a.m. to 8 p.m. Up to four visitors are allowed at one time in a patient room. The visitors may rotate out with other people during the day.  One visitor age 62 or older may stay with the patient overnight and must be in the room by 8 p.m.   Merchandiser, Retail to address health-related social needs:  [email or web address]

## 2024-02-16 NOTE — Anesthesia Preprocedure Evaluation (Addendum)
 Anesthesia Evaluation  Patient identified by MRN, date of birth, ID band Patient awake    Reviewed: Allergy & Precautions, NPO status , Patient's Chart, lab work & pertinent test results  History of Anesthesia Complications Negative for: history of anesthetic complications  Airway Mallampati: II  TM Distance: >3 FB Neck ROM: Full    Dental  (+) Upper Dentures, Lower Dentures, Dental Advidsory Given   Pulmonary shortness of breath and with exertion, neg sleep apnea, neg COPD, neg recent URI   breath sounds clear to auscultation- rhonchi (-) wheezing      Cardiovascular hypertension, Pt. on medications and Pt. on home beta blockers (-) angina (-) CAD, (-) Past MI, (-) Cardiac Stents and (-) CABG (-) dysrhythmias + Valvular Problems/Murmurs  Rhythm:Regular Rate:Normal - Systolic murmurs and - Diastolic murmurs ECHO 3/25: NORMAL LEFT VENTRICULAR SYSTOLIC FUNCTION WITH MILD LVH  ESTIMATED EF: >55%  ELEVATED LA PRESSURES WITH DIASTOLIC DYSFUNCTION (GRADE 2)  NORMAL RIGHT VENTRICULAR SYSTOLIC FUNCTION  VALVULAR REGURGITATION: TRIVIAL AR, TRIVIAL MR, TRIVIAL PR, MODERATE TR  NO VALVULAR STENOSIS     Neuro/Psych neg Seizures negative neurological ROS  negative psych ROS   GI/Hepatic Neg liver ROS,GERD  ,,  Endo/Other  diabetes, Oral Hypoglycemic Agents    Renal/GU CRFRenal disease     Musculoskeletal  (+) Arthritis ,    Abdominal  (+) + obese  Peds  Hematology  (+) Blood dyscrasia, anemia   Anesthesia Other Findings Past Medical History: Sarcoid No date: Anemia No date: DDD (degenerative disc disease), 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), thoracic No date: Diabetes mellitus without complication (HCC) No date: GERD (gastroesophageal reflux disease) No date: Heart murmur No date: History of kidney stones No date: Hyperlipidemia No date: Hypertension No date: Osteoporosis No date: Sarcoid No date:  Spinal stenosis of cervical region   Reproductive/Obstetrics                              Anesthesia Physical Anesthesia Plan  ASA: 3  Anesthesia Plan: General   Post-op Pain Management: Tylenol  PO (pre-op)*   Induction: Intravenous  PONV Risk Score and Plan: 2 and Dexamethasone  and Treatment may vary due to age or medical condition  Airway Management Planned: LMA  Additional Equipment:   Intra-op Plan:   Post-operative Plan: Extubation in OR  Informed Consent: I have reviewed the patients History and Physical, chart, labs and discussed the procedure including the risks, benefits and alternatives for the proposed anesthesia with the patient or authorized representative who has indicated his/her understanding and acceptance.     Dental advisory given  Plan Discussed with: CRNA and Anesthesiologist  Anesthesia Plan Comments:          Anesthesia Quick Evaluation

## 2024-02-17 ENCOUNTER — Encounter: Payer: Self-pay | Admitting: Urology

## 2024-02-17 ENCOUNTER — Ambulatory Visit: Admitting: Anesthesiology

## 2024-02-17 ENCOUNTER — Encounter: Admission: RE | Disposition: A | Payer: Self-pay | Source: Home / Self Care | Attending: Urology

## 2024-02-17 ENCOUNTER — Ambulatory Visit

## 2024-02-17 ENCOUNTER — Other Ambulatory Visit: Payer: Self-pay

## 2024-02-17 ENCOUNTER — Ambulatory Visit: Admission: RE | Admit: 2024-02-17 | Discharge: 2024-02-17 | Disposition: A | Attending: Urology | Admitting: Urology

## 2024-02-17 DIAGNOSIS — E119 Type 2 diabetes mellitus without complications: Secondary | ICD-10-CM

## 2024-02-17 DIAGNOSIS — N201 Calculus of ureter: Secondary | ICD-10-CM

## 2024-02-17 DIAGNOSIS — N200 Calculus of kidney: Secondary | ICD-10-CM | POA: Diagnosis not present

## 2024-02-17 HISTORY — PX: CYSTOSCOPY/URETEROSCOPY/HOLMIUM LASER/STENT PLACEMENT: SHX6546

## 2024-02-17 LAB — GLUCOSE, CAPILLARY
Glucose-Capillary: 89 mg/dL (ref 70–99)
Glucose-Capillary: 62 mg/dL — ABNORMAL LOW (ref 70–99)
Glucose-Capillary: 63 mg/dL — ABNORMAL LOW (ref 70–99)

## 2024-02-17 SURGERY — CYSTOSCOPY/URETEROSCOPY/HOLMIUM LASER/STENT PLACEMENT
Anesthesia: General | Site: Ureter | Laterality: Left

## 2024-02-17 MED ORDER — CHLORHEXIDINE GLUCONATE 0.12 % MT SOLN
OROMUCOSAL | Status: AC
  Filled 2024-02-17: qty 15

## 2024-02-17 MED ORDER — ROCURONIUM BROMIDE 100 MG/10ML IV SOLN
INTRAVENOUS | Status: DC | PRN
  Administered 2024-02-17: 10 mg via INTRAVENOUS
  Administered 2024-02-17: 50 mg via INTRAVENOUS

## 2024-02-17 MED ORDER — ROCURONIUM BROMIDE 10 MG/ML (PF) SYRINGE
PREFILLED_SYRINGE | INTRAVENOUS | Status: AC
  Filled 2024-02-17: qty 10

## 2024-02-17 MED ORDER — STERILE WATER FOR IRRIGATION IR SOLN
Status: DC | PRN
  Administered 2024-02-17: 500 mL

## 2024-02-17 MED ORDER — SUGAMMADEX SODIUM 200 MG/2ML IV SOLN
INTRAVENOUS | Status: DC | PRN
  Administered 2024-02-17: 180 mg via INTRAVENOUS

## 2024-02-17 MED ORDER — FENTANYL CITRATE (PF) 100 MCG/2ML IJ SOLN
INTRAMUSCULAR | Status: DC | PRN
  Administered 2024-02-17 (×2): 50 ug via INTRAVENOUS

## 2024-02-17 MED ORDER — SODIUM CHLORIDE 0.9 % IR SOLN
Status: DC | PRN
  Administered 2024-02-17: 3000 mL

## 2024-02-17 MED ORDER — LIDOCAINE HCL (PF) 2 % IJ SOLN
INTRAMUSCULAR | Status: AC
  Filled 2024-02-17: qty 5

## 2024-02-17 MED ORDER — TROSPIUM CHLORIDE 20 MG PO TABS
20.0000 mg | ORAL_TABLET | Freq: Two times a day (BID) | ORAL | 0 refills | Status: AC | PRN
  Filled 2024-02-17: qty 20, 10d supply, fill #0

## 2024-02-17 MED ORDER — ACETAMINOPHEN 500 MG PO TABS
ORAL_TABLET | ORAL | Status: AC
  Filled 2024-02-17: qty 2

## 2024-02-17 MED ORDER — PROPOFOL 10 MG/ML IV BOLUS
INTRAVENOUS | Status: AC
  Filled 2024-02-17: qty 20

## 2024-02-17 MED ORDER — LIDOCAINE HCL (CARDIAC) PF 100 MG/5ML IV SOSY
PREFILLED_SYRINGE | INTRAVENOUS | Status: DC | PRN
  Administered 2024-02-17: 100 mg via INTRAVENOUS

## 2024-02-17 MED ORDER — FENTANYL CITRATE (PF) 100 MCG/2ML IJ SOLN
INTRAMUSCULAR | Status: AC
  Filled 2024-02-17: qty 2

## 2024-02-17 MED ORDER — EPHEDRINE SULFATE-NACL 50-0.9 MG/10ML-% IV SOSY
PREFILLED_SYRINGE | INTRAVENOUS | Status: DC | PRN
  Administered 2024-02-17: 5 mg via INTRAVENOUS

## 2024-02-17 MED ORDER — PHENYLEPHRINE 80 MCG/ML (10ML) SYRINGE FOR IV PUSH (FOR BLOOD PRESSURE SUPPORT)
PREFILLED_SYRINGE | INTRAVENOUS | Status: DC | PRN
  Administered 2024-02-17: 80 ug via INTRAVENOUS

## 2024-02-17 MED ORDER — PROPOFOL 10 MG/ML IV BOLUS
INTRAVENOUS | Status: DC | PRN
  Administered 2024-02-17: 90 mg via INTRAVENOUS

## 2024-02-17 MED ORDER — EPHEDRINE 5 MG/ML INJ
INTRAVENOUS | Status: AC
  Filled 2024-02-17: qty 5

## 2024-02-17 MED ORDER — IOHEXOL 180 MG/ML  SOLN
INTRAMUSCULAR | Status: DC | PRN
  Administered 2024-02-17 (×2): 10 mL

## 2024-02-17 MED ORDER — PHENYLEPHRINE HCL-NACL 20-0.9 MG/250ML-% IV SOLN
INTRAVENOUS | Status: DC | PRN
  Administered 2024-02-17: 80 ug/min via INTRAVENOUS

## 2024-02-17 MED ORDER — DEXAMETHASONE SOD PHOSPHATE PF 10 MG/ML IJ SOLN
INTRAMUSCULAR | Status: DC | PRN
  Administered 2024-02-17: 10 mg via INTRAVENOUS

## 2024-02-17 SURGICAL SUPPLY — 22 items
BAG DRAIN SIEMENS DORNER NS (MISCELLANEOUS) ×1 IMPLANT
BASKET ZERO TIP 1.9FR (BASKET) IMPLANT
BRUSH SCRUB EZ 4% CHG (MISCELLANEOUS) ×1 IMPLANT
CATH URET FLEX-TIP 2 LUMEN 10F (CATHETERS) IMPLANT
CATH URETL OPEN END 6X70 (CATHETERS) IMPLANT
DRAPE UTILITY 15X26 TOWEL STRL (DRAPES) ×1 IMPLANT
FIBER LASER MOSES 200 DFL (Laser) IMPLANT
FIBER LASER MOSES 365 DFL (Laser) IMPLANT
GLOVE BIOGEL PI IND STRL 7.5 (GLOVE) ×1 IMPLANT
GOWN STRL REUS W/ TWL LRG LVL3 (GOWN DISPOSABLE) ×1 IMPLANT
GOWN STRL REUS W/ TWL XL LVL3 (GOWN DISPOSABLE) ×1 IMPLANT
GUIDEWIRE STR DUAL SENSOR (WIRE) ×1 IMPLANT
KIT TURNOVER CYSTO (KITS) ×1 IMPLANT
PACK CYSTO AR (MISCELLANEOUS) ×1 IMPLANT
SHEATH NAVIGATOR HD 12/14X36 (SHEATH) IMPLANT
SOL .9 NS 3000ML IRR UROMATIC (IV SOLUTION) ×1 IMPLANT
SOLN STERILE WATER 500 ML (IV SOLUTION) ×1 IMPLANT
STENT URET 6FRX24 CONTOUR (STENTS) IMPLANT
STENT URET 6FRX26 CONTOUR (STENTS) IMPLANT
SURGILUBE 2OZ TUBE FLIPTOP (MISCELLANEOUS) ×1 IMPLANT
TUBING THERMACLEAR UROLOGY (TUBING) ×1 IMPLANT
VALVE UROSEAL ADJ ENDO (VALVE) IMPLANT

## 2024-02-17 NOTE — Interval H&P Note (Signed)
 History and Physical Interval Note:  02/17/2024 1:16 PM  Anna Castro  has presented today for surgery, with the diagnosis of Left Ureteral Stone.  The various methods of treatment have been discussed with the patient and family. After consideration of risks, benefits and other options for treatment, the patient has consented to  Procedures: CYSTOSCOPY/URETEROSCOPY/HOLMIUM LASER/STENT PLACEMENT (Left) as a surgical intervention.  The patient's history has been reviewed, patient examined, no change in status, stable for surgery.  I have reviewed the patient's chart and labs.  Questions were answered to the patient's satisfaction.    CV:RRR Lungs:clear  Glendia JAYSON Barba

## 2024-02-17 NOTE — Anesthesia Procedure Notes (Signed)
 Procedure Name: Intubation Date/Time: 02/17/2024 1:53 PM  Performed by: Jackye Spanner, CRNAPre-anesthesia Checklist: Patient identified, Patient being monitored, Timeout performed, Emergency Drugs available and Suction available Patient Re-evaluated:Patient Re-evaluated prior to induction Oxygen Delivery Method: Circle system utilized Preoxygenation: Pre-oxygenation with 100% oxygen Induction Type: IV induction Ventilation: Mask ventilation without difficulty Laryngoscope Size: 3 and McGrath Grade View: Grade I Tube type: Oral Tube size: 7.0 mm Number of attempts: 1 Airway Equipment and Method: Stylet Placement Confirmation: ETT inserted through vocal cords under direct vision, positive ETCO2 and breath sounds checked- equal and bilateral Secured at: 21 cm Tube secured with: Tape Dental Injury: Teeth and Oropharynx as per pre-operative assessment  Comments: Smooth atraumatic intubation.

## 2024-02-17 NOTE — Transfer of Care (Signed)
 Immediate Anesthesia Transfer of Care Note  Patient: Anna Castro  Procedure(s) Performed: CYSTOSCOPY/URETEROSCOPY/HOLMIUM LASER/STENT PLACEMENT (Left: Ureter)  Patient Location: PACU  Anesthesia Type:General  Level of Consciousness: awake, alert , and drowsy  Airway & Oxygen Therapy: Patient Spontanous Breathing and Patient connected to face mask oxygen  Post-op Assessment: Report given to RN and Post -op Vital signs reviewed and stable  Post vital signs: Reviewed and stable  Last Vitals:  Vitals Value Taken Time  BP 149/62 02/17/24 15:01  Temp 35.9 1502  Pulse 65 02/17/24 15:03  Resp 13 02/17/24 15:03  SpO2 100 % 02/17/24 15:03  Vitals shown include unfiled device data.  Last Pain:  Vitals:   02/17/24 1129  TempSrc: Oral  PainSc: 0-No pain         Complications: No notable events documented.

## 2024-02-17 NOTE — Discharge Instructions (Addendum)
 DISCHARGE INSTRUCTIONS FOR KIDNEY STONE/URETERAL STENT   MEDICATIONS:  1. Resume all your other meds from home.  2.  AZO (over-the-counter) can help with the burning/stinging when you urinate. 3.  Trospium is for bladder spasm and urinary symptoms secondary to stent, Rx was sent to your pharmacy. 4.  Continue tamsulosin  which will also help the symptoms  ACTIVITY:  1. May resume regular activities in 24 hours. 2. No driving while on narcotic pain medications  3. Drink plenty of water    SIGNS/SYMPTOMS TO CALL:  Common postoperative symptoms include urinary frequency, urgency, bladder spasm and blood in the urine  Please call us  if you have a fever greater than 101.5, uncontrolled nausea/vomiting, uncontrolled pain, dizziness, unable to urinate, excessively bloody urine, chest pain, shortness of breath, leg swelling, leg pain, or any other concerns or questions.   You can reach us  at [phone].   FOLLOW-UP:  1. You will be contacted for a follow-up appointment 7-10 days for stent removal

## 2024-02-17 NOTE — Progress Notes (Signed)
 Dr.  Vicci : anesthesia made aware of blood sugar 62.  Patient awake/alert x4. Eating peanut butter crackers and drinking gingerale.  Patient instructed to check blood sugar when she arrives home, make sure she has dinner. Verbalizes understanding.

## 2024-02-17 NOTE — Op Note (Signed)
 Preoperative diagnosis:  Left proximal ureteral calculus Left nephrolithiasis  Postoperative diagnosis:  Same  Procedure:  Cystoscopy Left ureteroscopy and stone removal Ureteroscopic laser lithotripsy Left ureteral stent placement (99F/24 cm)  Left retrograde pyelography with interpretation  Surgeon: Glendia C. Rosell Khouri, M.D.  Anesthesia: General  Complications: None  Intraoperative findings:  Cystoscopy: Bladder mucosa without solid or papillary lesions.  UOs normal-appearing bilaterally Ureteropyeloscopy: Proximal calculus migrated back to renal pelvis after guidewire placement.  Two 5 mm calculi lower calyx; third 5 mm calculus lower pole calyx Left retrograde pyelography demonstrated a filling defect within the renal pelvis consistent with the patients known calculus without other abnormalities. Left retrograde pyelography post procedure showed no filling defects, stone fragments or contrast extravasation  EBL: Minimal  Specimens: Calculus fragments for analysis   Indication: Anna Castro is a 73 y.o. with a renal colic secondary to an 8 x 10 mm left proximal ureteral calculus.  Noted on CT to have lower calyceal calculi.  Refer to the admission H&P for details.  After reviewing the management options for treatment, the patient elected to proceed with the above surgical procedure(s). We have discussed the potential benefits and risks of the procedure, side effects of the proposed treatment, the likelihood of the patient achieving the goals of the procedure, and any potential problems that might occur during the procedure or recuperation. Informed consent has been obtained.  Description of procedure:  The patient was taken to the operating room and general anesthesia was induced.  The patient was placed in the dorsal lithotomy position, prepped and draped in the usual sterile fashion, and preoperative antibiotics were administered. A preoperative time-out was  performed.   A 21 Fr cystoscope was lubricated, placed per urethra.  Panendoscopy was performed with findings described above.    Attention was directed to the left ureteral orifice and a 0.038 Sensor wire was then advanced up the  ureter into the renal pelvis under fluoroscopic guidance.  The cystoscope was removed and a dual-lumen catheter was placed over the Sensor wire.  Retrograde pyelogram was performed and on placement of a second Sensor the calculus was noted to migrate into the renal pelvis.    The inner stylette of a 12/14 French ureteral access sheath was placed over the working wire and advanced under fluoroscopic guidance without difficulty.  The inner stylette and outer sheath were then advanced as a unit under fluoroscopy to the UPJ without difficulty.  A dual channel digital flexible ureteroscope was placed through the access sheath and advanced into the renal pelvis without difficulty.  Pyeloscopy was performed with findings as described above.  The 8 x 10 mm calculus was manipulated into a midpole calyx with the tip of the ureteroscope.  A 365 m Moses holmium laser fiber was placed through the ureteroscope and the 8 x 10 mm calculus was dusted at a setting of 0.3 J/80 hz.  The calculus was further treated with noncontact laser lithotripsy at a setting of 0.5 J/100 Hz.  A small fragment was placed in a 1.9 French nitinol basket and sent for analysis.  The lower calyceal calculi were treated in a similar fashion with noncontact laser lithotripsy at 0.5 J/100 Hz  Retrograde pyelogram was performed and each calyx was sequentially examined under fluoroscopic guidance and no fragments >1 mm in size were identified.  The ureteral access sheath and ureteroscope were removed in tandem and the ureter showed no evidence of injury or perforation.  A 6 F/24 cm Contour ureteral  stent was placed under fluoroscopic guidance.  The wire was then removed; the proximal tip was within an upper pole  calyx and the distal curl was well-positioned in the bladder.    The bladder was then emptied and the procedure ended.  The patient appeared to tolerate the procedure well and without complications.  After anesthetic reversal the patient was transported to the PACU in stable condition.   Plan: Office follow-up with stent removal will be scheduled in 7-10 days   Glendia Barba, MD

## 2024-02-17 NOTE — Anesthesia Postprocedure Evaluation (Signed)
 Anesthesia Post Note  Patient: Anna Castro  Procedure(s) Performed: CYSTOSCOPY/URETEROSCOPY/HOLMIUM LASER/STENT PLACEMENT (Left: Ureter)  Patient location during evaluation: PACU Anesthesia Type: General Level of consciousness: awake and alert Pain management: pain level controlled Vital Signs Assessment: post-procedure vital signs reviewed and stable Respiratory status: spontaneous breathing, nonlabored ventilation and respiratory function stable Cardiovascular status: blood pressure returned to baseline and stable Postop Assessment: no apparent nausea or vomiting Anesthetic complications: no   No notable events documented.   Last Vitals:  Vitals:   02/17/24 1539 02/17/24 1601  BP: (!) 150/54 (!) 148/55  Pulse: 64 65  Resp: 16 17  Temp: 36.4 C (!) 36.3 C  SpO2: 97% 99%    Last Pain:  Vitals:   02/17/24 1601  TempSrc: Temporal  PainSc: 0-No pain                 Camellia Merilee Louder

## 2024-02-18 ENCOUNTER — Encounter: Payer: Self-pay | Admitting: Urology

## 2024-02-23 ENCOUNTER — Encounter: Payer: Self-pay | Admitting: Urology

## 2024-02-23 ENCOUNTER — Ambulatory Visit: Admitting: Urology

## 2024-02-23 DIAGNOSIS — Z466 Encounter for fitting and adjustment of urinary device: Secondary | ICD-10-CM

## 2024-02-23 MED ORDER — SULFAMETHOXAZOLE-TRIMETHOPRIM 800-160 MG PO TABS
1.0000 | ORAL_TABLET | Freq: Once | ORAL | Status: AC
  Administered 2024-02-23: 13:00:00 1 via ORAL

## 2024-02-23 NOTE — Patient Instructions (Signed)
 SABRA

## 2024-02-23 NOTE — Progress Notes (Unsigned)
° °  Indications: Patient is 73 y.o., who is s/p ureteroscopic removal of a left proximal ureteral calculus and left renal calculi on 02/17/2024.  The patient is presenting today for stent removal.  No postoperative problems.  UA today microscopy 11-30 WBC/>30 RBC/nitrite negative  Stone analysis: Pending  Procedure:  Flexible Cystoscopy with stent removal (47689)  Timeout was performed and the correct patient, procedure and participants were identified.    Description:  The patient was prepped and draped in the usual sterile fashion. Flexible cystosopy was performed.  The stent was visualized, grasped, and removed intact without difficulty. The patient tolerated the procedure well.  A single dose of oral antibiotics was given.  Complications:  None  Plan:  RUS 1 month Discussed general stone prevention guidelines versus metabolic evaluation. ***   Glendia Barba, MD

## 2024-02-24 LAB — MICROSCOPIC EXAMINATION: RBC, Urine: >30 /HPF — AB (ref 0–2)

## 2024-02-24 LAB — URINALYSIS, COMPLETE
Bilirubin, UA: NEGATIVE
Glucose, UA: NEGATIVE
Ketones, UA: NEGATIVE
Nitrite, UA: NEGATIVE
Specific Gravity, UA: 1.03 (ref 1.005–1.030)
Urobilinogen, Ur: 0.2 mg/dL (ref 0.2–1.0)
pH, UA: 6 (ref 5.0–7.5)

## 2024-02-25 ENCOUNTER — Encounter: Payer: Self-pay | Admitting: Urology

## 2024-02-25 ENCOUNTER — Ambulatory Visit
Admission: RE | Admit: 2024-02-25 | Discharge: 2024-02-25 | Disposition: A | Discharge status: Home / Self Care | Source: Ambulatory Visit | Attending: Urology | Admitting: Urology

## 2024-02-25 DIAGNOSIS — Z466 Encounter for fitting and adjustment of urinary device: Secondary | ICD-10-CM | POA: Diagnosis present

## 2024-02-25 DIAGNOSIS — N132 Hydronephrosis with renal and ureteral calculous obstruction: Secondary | ICD-10-CM | POA: Diagnosis not present

## 2024-02-25 LAB — STONE ANALYSIS
Uric Acid Calculi: 100 %
Weight Calculi: 5 mg

## 2024-02-27 ENCOUNTER — Ambulatory Visit: Payer: Self-pay | Admitting: Urology

## 2024-02-28 ENCOUNTER — Other Ambulatory Visit: Payer: Self-pay | Admitting: *Deleted

## 2024-02-28 DIAGNOSIS — N132 Hydronephrosis with renal and ureteral calculous obstruction: Secondary | ICD-10-CM

## 2024-03-06 ENCOUNTER — Ambulatory Visit
Admission: RE | Admit: 2024-03-06 | Discharge: 2024-03-06 | Disposition: A | Discharge status: Home / Self Care | Source: Ambulatory Visit | Attending: Urology | Admitting: Urology

## 2024-03-06 DIAGNOSIS — N132 Hydronephrosis with renal and ureteral calculous obstruction: Secondary | ICD-10-CM | POA: Insufficient documentation

## 2024-03-08 ENCOUNTER — Encounter (HOSPITAL_COMMUNITY): Payer: Self-pay

## 2024-03-10 ENCOUNTER — Telehealth (HOSPITAL_COMMUNITY): Payer: Self-pay | Admitting: *Deleted

## 2024-03-10 NOTE — Telephone Encounter (Signed)
 Attempted to call patient regarding upcoming cardiac CT appointment. Left message on voicemail with name and callback number  Larey Brick RN Navigator Cardiac Imaging [facility] [phone] Office [phone] Cell

## 2024-03-13 ENCOUNTER — Ambulatory Visit
Admission: RE | Admit: 2024-03-13 | Discharge: 2024-03-13 | Disposition: A | Discharge status: Home / Self Care | Source: Ambulatory Visit | Attending: Cardiovascular Disease | Admitting: Cardiovascular Disease

## 2024-03-13 DIAGNOSIS — I2089 Other forms of angina pectoris: Secondary | ICD-10-CM | POA: Insufficient documentation

## 2024-03-13 DIAGNOSIS — I700 Atherosclerosis of aorta: Secondary | ICD-10-CM | POA: Diagnosis present

## 2024-03-13 DIAGNOSIS — R0789 Other chest pain: Secondary | ICD-10-CM | POA: Insufficient documentation

## 2024-03-13 MED ORDER — METOPROLOL TARTRATE 5 MG/5ML IV SOLN: 10.0000 mg | Freq: Once | INTRAVENOUS | Status: DC | PRN

## 2024-03-13 MED ORDER — DILTIAZEM HCL 25 MG/5ML IV SOLN: 10.0000 mg | INTRAVENOUS | Status: DC | PRN

## 2024-03-13 MED ORDER — IOHEXOL 350 MG/ML SOLN
80.0000 mL | Freq: Once | INTRAVENOUS | Status: AC | PRN
  Administered 2024-03-13: 80 mL via INTRAVENOUS

## 2024-03-13 MED ORDER — NITROGLYCERIN 0.4 MG SL SUBL
0.8000 mg | SUBLINGUAL_TABLET | Freq: Once | SUBLINGUAL | Status: AC
  Administered 2024-03-13: 0.8 mg via SUBLINGUAL
  Filled 2024-03-13: qty 25

## 2024-03-15 LAB — POCT I-STAT CREATININE: Creatinine, Ser: 1.4 mg/dL — ABNORMAL HIGH (ref 0.44–1.00)

## 2024-03-19 ENCOUNTER — Ambulatory Visit: Payer: Self-pay | Admitting: Urology

## 2024-09-19 ENCOUNTER — Ambulatory Visit: Admitting: Urology
